# Patient Record
Sex: Female | Born: 1980 | Race: Black or African American | Hispanic: No | Marital: Single | Smoking: Never smoker
Health system: Southern US, Community
[De-identification: ages and names within clinical notes are randomized; demographics above are authoritative.]

## PROBLEM LIST (undated history)

## (undated) DIAGNOSIS — I10 Essential (primary) hypertension: Secondary | ICD-10-CM

## (undated) DIAGNOSIS — A6000 Herpesviral infection of urogenital system, unspecified: Secondary | ICD-10-CM

## (undated) DIAGNOSIS — J45909 Unspecified asthma, uncomplicated: Secondary | ICD-10-CM

## (undated) DIAGNOSIS — T7840XA Allergy, unspecified, initial encounter: Secondary | ICD-10-CM

## (undated) DIAGNOSIS — E669 Obesity, unspecified: Secondary | ICD-10-CM

## (undated) HISTORY — DX: Obesity, unspecified: E66.9

## (undated) HISTORY — DX: Herpesviral infection of urogenital system, unspecified: A60.00

## (undated) HISTORY — DX: Allergy, unspecified, initial encounter: T78.40XA

---

## 2006-12-08 ENCOUNTER — Emergency Department (HOSPITAL_COMMUNITY): Admission: EM | Admit: 2006-12-08 | Discharge: 2006-12-08 | Payer: Self-pay | Admitting: Emergency Medicine

## 2010-07-17 ENCOUNTER — Ambulatory Visit: Payer: Self-pay | Admitting: Diagnostic Radiology

## 2010-07-17 ENCOUNTER — Emergency Department (HOSPITAL_BASED_OUTPATIENT_CLINIC_OR_DEPARTMENT_OTHER): Admission: EM | Admit: 2010-07-17 | Discharge: 2010-07-17 | Payer: Self-pay | Admitting: Emergency Medicine
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
## 2012-05-05 LAB — HM PAP SMEAR

## 2013-01-13 ENCOUNTER — Encounter: Payer: Self-pay | Admitting: *Deleted

## 2013-01-13 ENCOUNTER — Ambulatory Visit: Payer: Self-pay | Admitting: Family Medicine

## 2013-01-15 ENCOUNTER — Ambulatory Visit (INDEPENDENT_AMBULATORY_CARE_PROVIDER_SITE_OTHER): Payer: BC Managed Care – PPO | Admitting: Family Medicine

## 2013-01-15 ENCOUNTER — Encounter: Payer: Self-pay | Admitting: Family Medicine

## 2013-01-15 ENCOUNTER — Ambulatory Visit: Payer: BC Managed Care – PPO | Admitting: Family Medicine

## 2013-01-15 VITALS — BP 142/99 | HR 76 | Wt 240.0 lb

## 2013-01-15 DIAGNOSIS — R059 Cough, unspecified: Secondary | ICD-10-CM

## 2013-01-15 DIAGNOSIS — R05 Cough: Secondary | ICD-10-CM

## 2013-01-15 DIAGNOSIS — J309 Allergic rhinitis, unspecified: Secondary | ICD-10-CM

## 2013-01-15 DIAGNOSIS — J209 Acute bronchitis, unspecified: Secondary | ICD-10-CM

## 2013-01-15 MED ORDER — BENZONATATE 200 MG PO CAPS: 200.0000 mg | ORAL_CAPSULE | Freq: Three times a day (TID) | ORAL | Status: DC | PRN

## 2013-01-15 MED ORDER — MONTELUKAST SODIUM 10 MG PO TABS: 10.0000 mg | ORAL_TABLET | Freq: Every day | ORAL | Status: AC

## 2013-01-15 MED ORDER — HYDROCODONE-HOMATROPINE 5-1.5 MG/5ML PO SYRP: ORAL_SOLUTION | ORAL | Status: DC

## 2013-01-15 NOTE — Patient Instructions (Addendum)
1)  Bronchitis:  Get back on your Singulair at night.  Add some Hycodan at night and Tessalon Perles 3 times per day for cough.  You can take Tessalon Perles and Delsym 2 tsp 2 times per day if needed.  You may also want to try some [facility].    Bronchitis Bronchitis is the body's way of reacting to injury and/or infection (inflammation) of the bronchi. Bronchi are the air tubes that extend from the windpipe into the lungs. If the inflammation becomes severe, it may cause shortness of breath. CAUSES  Inflammation may be caused by:  A virus.  Germs (bacteria).  Dust.  Allergens.  Pollutants and many other irritants. The cells lining the bronchial tree are covered with tiny hairs (cilia). These constantly beat upward, away from the lungs, toward the mouth. This keeps the lungs free of pollutants. When these cells become too irritated and are unable to do their job, mucus begins to develop. This causes the characteristic cough of bronchitis. The cough clears the lungs when the cilia are unable to do their job. Without either of these protective mechanisms, the mucus would settle in the lungs. Then you would develop pneumonia. Smoking is a common cause of bronchitis and can contribute to pneumonia. Stopping this habit is the single most important thing you can do to help yourself. TREATMENT   Your caregiver may prescribe an antibiotic if the cough is caused by bacteria. Also, medicines that open up your airways make it easier to breathe. Your caregiver may also recommend or prescribe an expectorant. It will loosen the mucus to be coughed up. Only take over-the-counter or prescription medicines for pain, discomfort, or fever as directed by your caregiver.  Removing whatever causes the problem (smoking, for example) is critical to preventing the problem from getting worse.  Cough suppressants may be prescribed for relief of cough symptoms.  Inhaled medicines may be prescribed to help with  symptoms now and to help prevent problems from returning.  For those with recurrent (chronic) bronchitis, there may be a need for steroid medicines. SEEK IMMEDIATE MEDICAL CARE IF:   During treatment, you develop more pus-like mucus (purulent sputum).  You have a fever.  Your baby is older than 3 months with a rectal temperature of 102 F (38.9 C) or higher.  Your baby is 75 months old or younger with a rectal temperature of 100.4 F (38 C) or higher.  You become progressively more ill.  You have increased difficulty breathing, wheezing, or shortness of breath. It is necessary to seek immediate medical care if you are elderly or sick from any other disease. MAKE SURE YOU:   Understand these instructions.  Will watch your condition.  Will get help right away if you are not doing well or get worse. Document Released: 10/07/2005 Document Revised: 12/30/2011 Document Reviewed: 08/16/2008 [facility] Patient Information 2013 [place], [place].

## 2013-01-15 NOTE — Progress Notes (Signed)
Subjective:     Patient ID: April Lewis, female   DOB: 03-13-1981, 32 y.o.   MRN: [id number]  HPI April Lewis is here today to discuss her chest congestion and cough.  She first became ill on 12/13/12 and was seen at a [facility].  She was given a round of Medrol-pack, albuterol inhahler and cough syrup.  She felt some relief but her symptoms returned on 01/12/13 and she was seen again at the urgent care.  This time she was also put on Symbicort and was given an antibiotic.  She continues to struggle with her symptoms and wanted another opinion about what to do to get better.  She has been on Singulair in the past but has been out of this medication.     Review of Systems  Respiratory: Negative for cough, chest tightness, shortness of breath and wheezing.        Objective:   Physical Exam  Constitutional: She appears well-nourished. No distress.  Neck: No thyromegaly present.  Cardiovascular: Normal rate, regular rhythm and normal heart sounds.   Pulmonary/Chest: Effort normal and breath sounds normal. She has no wheezes.  Lymphadenopathy:    She has no cervical adenopathy.       Assessment:     Cough     Plan:      She is going to get back on her Singulair.  She was also given medications for her cough.

## 2013-01-17 ENCOUNTER — Encounter: Payer: Self-pay | Admitting: Family Medicine

## 2013-01-19 ENCOUNTER — Encounter: Payer: Self-pay | Admitting: Family Medicine

## 2013-01-21 DIAGNOSIS — R05 Cough: Secondary | ICD-10-CM | POA: Insufficient documentation

## 2013-01-21 DIAGNOSIS — R059 Cough, unspecified: Secondary | ICD-10-CM | POA: Insufficient documentation

## 2013-01-21 DIAGNOSIS — J209 Acute bronchitis, unspecified: Secondary | ICD-10-CM | POA: Insufficient documentation

## 2013-01-21 DIAGNOSIS — J309 Allergic rhinitis, unspecified: Secondary | ICD-10-CM | POA: Insufficient documentation

## 2013-06-25 ENCOUNTER — Other Ambulatory Visit: Payer: Self-pay | Admitting: *Deleted

## 2013-06-25 ENCOUNTER — Other Ambulatory Visit: Payer: Self-pay

## 2013-06-25 DIAGNOSIS — R5381 Other malaise: Secondary | ICD-10-CM

## 2013-06-25 DIAGNOSIS — E785 Hyperlipidemia, unspecified: Secondary | ICD-10-CM

## 2013-06-25 LAB — COMPLETE METABOLIC PANEL WITH GFR
ALT: 10 U/L (ref 0–35)
AST: 12 U/L (ref 0–37)
Albumin: 3.7 g/dL (ref 3.5–5.2)
Alkaline Phosphatase: 58 U/L (ref 39–117)
BUN: 11 mg/dL (ref 6–23)
CO2: 28 mEq/L (ref 19–32)
Calcium: 9.1 mg/dL (ref 8.4–10.5)
Chloride: 103 mEq/L (ref 96–112)
Creat: 0.7 mg/dL (ref 0.50–1.10)
GFR, Est African American: >89 mL/min
GFR, Est Non African American: >89 mL/min
Glucose, Bld: 79 mg/dL (ref 70–99)
Potassium: 4.4 mEq/L (ref 3.5–5.3)
Sodium: 138 mEq/L (ref 135–145)
Total Bilirubin: 0.3 mg/dL (ref 0.3–1.2)
Total Protein: 6.3 g/dL (ref 6.0–8.3)

## 2013-06-25 LAB — LIPID PANEL
Cholesterol: 157 mg/dL (ref 0–200)
HDL: 46 mg/dL (ref >39)
LDL Cholesterol: 95 mg/dL (ref 0–99)
Total CHOL/HDL Ratio: 3.4 Ratio
Triglycerides: 78 mg/dL (ref <150)
VLDL: 16 mg/dL (ref 0–40)

## 2013-06-25 LAB — CBC WITH DIFFERENTIAL/PLATELET
Basophils Absolute: 0 10*3/uL (ref 0.0–0.1)
Basophils Relative: 0 % (ref 0–1)
Eosinophils Absolute: 0.7 10*3/uL (ref 0.0–0.7)
Eosinophils Relative: 8 % — ABNORMAL HIGH (ref 0–5)
HCT: 35.9 % — ABNORMAL LOW (ref 36.0–46.0)
Hemoglobin: 12.6 g/dL (ref 12.0–15.0)
Lymphocytes Relative: 32 % (ref 12–46)
Lymphs Abs: 3.1 10*3/uL (ref 0.7–4.0)
MCH: 28.6 pg (ref 26.0–34.0)
MCHC: 35.1 g/dL (ref 30.0–36.0)
MCV: 81.4 fL (ref 78.0–100.0)
Monocytes Absolute: 0.5 10*3/uL (ref 0.1–1.0)
Monocytes Relative: 5 % (ref 3–12)
Neutro Abs: 5.2 10*3/uL (ref 1.7–7.7)
Neutrophils Relative %: 55 % (ref 43–77)
Platelets: 376 10*3/uL (ref 150–400)
RBC: 4.41 MIL/uL (ref 3.87–5.11)
RDW: 15.1 % (ref 11.5–15.5)
WBC: 9.4 10*3/uL (ref 4.0–10.5)

## 2013-06-25 LAB — TSH: TSH: 1.032 u[IU]/mL (ref 0.350–4.500)

## 2013-07-06 ENCOUNTER — Ambulatory Visit (INDEPENDENT_AMBULATORY_CARE_PROVIDER_SITE_OTHER): Payer: BC Managed Care – PPO | Admitting: Family Medicine

## 2013-07-06 ENCOUNTER — Other Ambulatory Visit (HOSPITAL_COMMUNITY)
Admission: RE | Admit: 2013-07-06 | Discharge: 2013-07-06 | Disposition: A | Discharge status: Home / Self Care | Payer: BC Managed Care – PPO | Source: Ambulatory Visit | Attending: Family Medicine | Admitting: Family Medicine

## 2013-07-06 ENCOUNTER — Encounter: Payer: Self-pay | Admitting: Family Medicine

## 2013-07-06 VITALS — BP 111/74 | HR 75 | Resp 16 | Ht 63.25 in | Wt 238.0 lb

## 2013-07-06 DIAGNOSIS — Z01419 Encounter for gynecological examination (general) (routine) without abnormal findings: Secondary | ICD-10-CM | POA: Insufficient documentation

## 2013-07-06 DIAGNOSIS — F329 Major depressive disorder, single episode, unspecified: Secondary | ICD-10-CM

## 2013-07-06 DIAGNOSIS — Z124 Encounter for screening for malignant neoplasm of cervix: Secondary | ICD-10-CM

## 2013-07-06 DIAGNOSIS — Z23 Encounter for immunization: Secondary | ICD-10-CM

## 2013-07-06 DIAGNOSIS — Z1151 Encounter for screening for human papillomavirus (HPV): Secondary | ICD-10-CM | POA: Insufficient documentation

## 2013-07-06 DIAGNOSIS — R5381 Other malaise: Secondary | ICD-10-CM

## 2013-07-06 DIAGNOSIS — Z0000 Encounter for general adult medical examination without abnormal findings: Secondary | ICD-10-CM

## 2013-07-06 DIAGNOSIS — F32A Depression, unspecified: Secondary | ICD-10-CM

## 2013-07-06 DIAGNOSIS — F3289 Other specified depressive episodes: Secondary | ICD-10-CM

## 2013-07-06 MED ORDER — TERCONAZOLE 0.8 % VA CREA: 1.0000 | TOPICAL_CREAM | Freq: Every day | VAGINAL | Status: AC

## 2013-07-06 MED ORDER — WELLBUTRIN XL 150 MG PO TB24: 150.0000 mg | ORAL_TABLET | ORAL | Status: DC

## 2013-07-06 MED ORDER — WELLBUTRIN XL 300 MG PO TB24: 300.0000 mg | ORAL_TABLET | ORAL | Status: DC

## 2013-07-06 MED ORDER — FLUCONAZOLE 150 MG PO TABS: 150.0000 mg | ORAL_TABLET | Freq: Once | ORAL | Status: AC

## 2013-07-06 NOTE — Progress Notes (Signed)
Subjective:    Patient ID: April Lewis, female    DOB: 09/22/1981, 32 y.o.   MRN: [id number]  HPI  April Lewis is here today for her annual CPE with pap smear.  She has been feeling very fatigued since her last visit.   She also feels that her weight is contributing to her feeling depressed.       Review of Systems  Constitutional: Positive for fatigue.  HENT: Negative.   Eyes: Negative.   Respiratory: Negative.   Cardiovascular: Negative.   Gastrointestinal: Negative.   Endocrine: Negative.   Genitourinary: Negative.   Musculoskeletal: Negative.   Skin: Negative.   Allergic/Immunologic: Negative.   Neurological: Negative.   Hematological: Negative.   Psychiatric/Behavioral: Positive for dysphoric mood.     Past Medical History  Diagnosis Date  . Allergy   . Obesity   . Genital herpes    History   Social History  . Marital Status: Single    Spouse Name: N/A    Number of Children: N/A  . Years of Education: N/A   Occupational History  . Not on file.   Social History Main Topics  . Smoking status: Former Smoker    Types: Cigarettes    Quit date: 10/21/2010  . Smokeless tobacco: Never Used  . Alcohol Use: Yes     Comment: Occasionally  . Drug Use: No  . Sexual Activity: Not Currently   Other Topics Concern  . 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 Hypertension Mother   . Asthma Mother   . Diabetes Maternal Aunt   . Asthma Maternal Grandfather   . Diabetes Paternal Grandmother   . Hypertension Paternal Grandmother   . 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) Psychology (BA)    Alcohol Use:  Occasional   Drug Use:  None   Diet:  Regular   Exercise:  None   Hobbies: Reading/ Watching TV                            Objective:   Physical Exam  Vitals reviewed. Constitutional: She is oriented to person, place, and time. She appears well-developed and well-nourished.  HENT:  Head: Normocephalic and atraumatic.  Right Ear: External ear normal.  Left Ear: External ear normal.  Nose: Nose normal.  Mouth/Throat: Oropharynx is clear and moist.  Eyes: Conjunctivae and EOM are normal. Pupils are equal, round, and reactive to light.  Neck: Normal range of motion. No thyromegaly present.  Cardiovascular: Normal rate, regular rhythm, normal heart sounds and  intact distal pulses.  Exam reveals no gallop and no friction rub.   No murmur heard. Pulmonary/Chest: Effort normal and breath sounds normal. Right breast exhibits no inverted nipple, no mass, no nipple discharge, no skin change and no tenderness. Left breast exhibits no inverted nipple, no mass, no nipple discharge, no skin change and no tenderness. Breasts are symmetrical.  Abdominal: Soft. Bowel sounds are normal. Hernia confirmed negative in the right inguinal area and confirmed negative in the left inguinal area.  Genitourinary: Vagina normal and uterus normal. Pelvic exam was performed with patient supine. There is no rash, tenderness or lesion on the right labia. There is no rash, tenderness or lesion on the left labia. No vaginal discharge found.  Musculoskeletal: Normal range of motion. She exhibits no edema and no tenderness.  Lymphadenopathy:    She has no cervical adenopathy.       Right: No inguinal  adenopathy present.       Left: No inguinal adenopathy present.  Neurological: She is alert and oriented to person, place, and time. She has normal reflexes.  Skin: Skin is warm and dry.  Tattoos (3 Leaf Clover - Behind Right Ear, Stars - Lower Mid Back, Cancer Zodiac Sign - Upper Mid Back; Necklace W/Cross - Right Ankle, Chinese Symbol - Upper Right Foot, Butterfly - Left Lateral Calf, Heart/Name - Upper Left Arm.)   Psychiatric: She has a normal mood and affect. Her behavior is normal. Judgment and thought content normal.          Assessment & Plan:

## 2013-09-18 DIAGNOSIS — R5381 Other malaise: Secondary | ICD-10-CM | POA: Insufficient documentation

## 2013-09-18 DIAGNOSIS — Z0000 Encounter for general adult medical examination without abnormal findings: Secondary | ICD-10-CM | POA: Insufficient documentation

## 2013-09-18 DIAGNOSIS — Z23 Encounter for immunization: Secondary | ICD-10-CM | POA: Insufficient documentation

## 2013-09-18 DIAGNOSIS — F32A Depression, unspecified: Secondary | ICD-10-CM | POA: Insufficient documentation

## 2013-09-18 DIAGNOSIS — F329 Major depressive disorder, single episode, unspecified: Secondary | ICD-10-CM | POA: Insufficient documentation

## 2013-09-18 DIAGNOSIS — Z124 Encounter for screening for malignant neoplasm of cervix: Secondary | ICD-10-CM | POA: Insufficient documentation

## 2013-09-18 NOTE — Assessment & Plan Note (Signed)
Normal exam; we discussed preventative issues for the patient's sex and age.   

## 2013-09-18 NOTE — Assessment & Plan Note (Signed)
A pap was done without difficulty.  We're checking for high risk HPV with reflex to 16/18 if positive.   

## 2013-09-18 NOTE — Assessment & Plan Note (Signed)
We'll see if her energy level improves with Wellbutrin.

## 2013-09-18 NOTE — Assessment & Plan Note (Signed)
The patient confirmed that they are not allergic to eggs and have never had a bad reaction with the flu shot in the past.  The vaccination was given without difficulty.   

## 2013-09-18 NOTE — Assessment & Plan Note (Signed)
She was given prescriptions for Wellbutrin.

## 2013-11-06 ENCOUNTER — Other Ambulatory Visit: Payer: Self-pay | Admitting: Family Medicine

## 2014-02-20 ENCOUNTER — Other Ambulatory Visit: Payer: Self-pay | Admitting: Family Medicine

## 2014-02-25 ENCOUNTER — Ambulatory Visit: Payer: BC Managed Care – PPO | Admitting: Family Medicine

## 2020-11-10 ENCOUNTER — Encounter (HOSPITAL_COMMUNITY): Payer: Self-pay

## 2020-11-10 ENCOUNTER — Inpatient Hospital Stay (HOSPITAL_COMMUNITY): Payer: 59

## 2020-11-10 ENCOUNTER — Other Ambulatory Visit: Payer: Self-pay

## 2020-11-10 ENCOUNTER — Inpatient Hospital Stay (HOSPITAL_COMMUNITY)
Admission: EM | Admit: 2020-11-10 | Discharge: 2020-11-18 | DRG: 177 | Disposition: A | Discharge status: Home / Self Care | Payer: 59 | Attending: Internal Medicine | Admitting: Internal Medicine

## 2020-11-10 ENCOUNTER — Emergency Department (HOSPITAL_COMMUNITY): Payer: 59

## 2020-11-10 DIAGNOSIS — Z7951 Long term (current) use of inhaled steroids: Secondary | ICD-10-CM

## 2020-11-10 DIAGNOSIS — U071 COVID-19: Principal | ICD-10-CM | POA: Diagnosis present

## 2020-11-10 DIAGNOSIS — J9601 Acute respiratory failure with hypoxia: Secondary | ICD-10-CM | POA: Diagnosis present

## 2020-11-10 DIAGNOSIS — E876 Hypokalemia: Secondary | ICD-10-CM | POA: Diagnosis not present

## 2020-11-10 DIAGNOSIS — R7989 Other specified abnormal findings of blood chemistry: Secondary | ICD-10-CM | POA: Diagnosis present

## 2020-11-10 DIAGNOSIS — J1282 Pneumonia due to coronavirus disease 2019: Secondary | ICD-10-CM | POA: Diagnosis present

## 2020-11-10 DIAGNOSIS — J810 Acute pulmonary edema: Secondary | ICD-10-CM | POA: Diagnosis not present

## 2020-11-10 DIAGNOSIS — R609 Edema, unspecified: Secondary | ICD-10-CM | POA: Diagnosis not present

## 2020-11-10 DIAGNOSIS — I10 Essential (primary) hypertension: Secondary | ICD-10-CM | POA: Diagnosis present

## 2020-11-10 DIAGNOSIS — J45909 Unspecified asthma, uncomplicated: Secondary | ICD-10-CM | POA: Diagnosis present

## 2020-11-10 DIAGNOSIS — Z833 Family history of diabetes mellitus: Secondary | ICD-10-CM

## 2020-11-10 DIAGNOSIS — R0600 Dyspnea, unspecified: Secondary | ICD-10-CM

## 2020-11-10 DIAGNOSIS — Z880 Allergy status to penicillin: Secondary | ICD-10-CM

## 2020-11-10 DIAGNOSIS — Z8249 Family history of ischemic heart disease and other diseases of the circulatory system: Secondary | ICD-10-CM | POA: Diagnosis not present

## 2020-11-10 DIAGNOSIS — E669 Obesity, unspecified: Secondary | ICD-10-CM | POA: Diagnosis not present

## 2020-11-10 DIAGNOSIS — Z825 Family history of asthma and other chronic lower respiratory diseases: Secondary | ICD-10-CM

## 2020-11-10 DIAGNOSIS — Z79899 Other long term (current) drug therapy: Secondary | ICD-10-CM | POA: Diagnosis not present

## 2020-11-10 DIAGNOSIS — Z6841 Body Mass Index (BMI) 40.0 and over, adult: Secondary | ICD-10-CM

## 2020-11-10 DIAGNOSIS — F32A Depression, unspecified: Secondary | ICD-10-CM | POA: Diagnosis present

## 2020-11-10 HISTORY — DX: Unspecified asthma, uncomplicated: J45.909

## 2020-11-10 HISTORY — DX: Essential (primary) hypertension: I10

## 2020-11-10 LAB — COMPREHENSIVE METABOLIC PANEL
ALT: 24 U/L (ref 0–44)
AST: 39 U/L (ref 15–41)
Albumin: 3.9 g/dL (ref 3.5–5.0)
Alkaline Phosphatase: 63 U/L (ref 38–126)
Anion gap: 11 (ref 5–15)
BUN: 5 mg/dL — ABNORMAL LOW (ref 6–20)
CO2: 26 mmol/L (ref 22–32)
Calcium: 8.9 mg/dL (ref 8.9–10.3)
Chloride: 97 mmol/L — ABNORMAL LOW (ref 98–111)
Creatinine, Ser: 0.9 mg/dL (ref 0.44–1.00)
GFR, Estimated: >60 mL/min (ref >60)
Glucose, Bld: 122 mg/dL — ABNORMAL HIGH (ref 70–99)
Potassium: 3.5 mmol/L (ref 3.5–5.1)
Sodium: 134 mmol/L — ABNORMAL LOW (ref 135–145)
Total Bilirubin: 0.4 mg/dL (ref 0.3–1.2)
Total Protein: 8 g/dL (ref 6.5–8.1)

## 2020-11-10 LAB — CBC WITH DIFFERENTIAL/PLATELET
Abs Immature Granulocytes: 0.04 10*3/uL (ref 0.00–0.07)
Basophils Absolute: 0 10*3/uL (ref 0.0–0.1)
Basophils Relative: 0 %
Eosinophils Absolute: 0 10*3/uL (ref 0.0–0.5)
Eosinophils Relative: 0 %
HCT: 40.7 % (ref 36.0–46.0)
Hemoglobin: 12.8 g/dL (ref 12.0–15.0)
Immature Granulocytes: 1 %
Lymphocytes Relative: 9 %
Lymphs Abs: 0.7 10*3/uL (ref 0.7–4.0)
MCH: 24.2 pg — ABNORMAL LOW (ref 26.0–34.0)
MCHC: 31.4 g/dL (ref 30.0–36.0)
MCV: 76.9 fL — ABNORMAL LOW (ref 80.0–100.0)
Monocytes Absolute: 0.3 10*3/uL (ref 0.1–1.0)
Monocytes Relative: 3 %
Neutro Abs: 7.6 10*3/uL (ref 1.7–7.7)
Neutrophils Relative %: 87 %
Platelets: 300 10*3/uL (ref 150–400)
RBC: 5.29 MIL/uL — ABNORMAL HIGH (ref 3.87–5.11)
RDW: 16.6 % — ABNORMAL HIGH (ref 11.5–15.5)
WBC: 8.7 10*3/uL (ref 4.0–10.5)
nRBC: 0 % (ref 0.0–0.2)

## 2020-11-10 LAB — TROPONIN I (HIGH SENSITIVITY)
Troponin I (High Sensitivity): 18 ng/L — ABNORMAL HIGH (ref <18)
Troponin I (High Sensitivity): 18 ng/L — ABNORMAL HIGH (ref <18)
Troponin I (High Sensitivity): 16 ng/L (ref <18)

## 2020-11-10 LAB — D-DIMER, QUANTITATIVE: D-Dimer, Quant: 0.59 ug/mL-FEU — ABNORMAL HIGH (ref 0.00–0.50)

## 2020-11-10 LAB — FIBRINOGEN: Fibrinogen: 557 mg/dL — ABNORMAL HIGH (ref 210–475)

## 2020-11-10 LAB — I-STAT BETA HCG BLOOD, ED (MC, WL, AP ONLY): I-stat hCG, quantitative: <5 m[IU]/mL (ref <5)

## 2020-11-10 LAB — TRIGLYCERIDES: Triglycerides: 115 mg/dL (ref <150)

## 2020-11-10 LAB — C-REACTIVE PROTEIN: CRP: 13 mg/dL — ABNORMAL HIGH (ref <1.0)

## 2020-11-10 LAB — LACTIC ACID, PLASMA
Lactic Acid, Venous: 2.2 mmol/L (ref 0.5–1.9)
Lactic Acid, Venous: 1.6 mmol/L (ref 0.5–1.9)

## 2020-11-10 LAB — FERRITIN: Ferritin: 78 ng/mL (ref 11–307)

## 2020-11-10 LAB — PROCALCITONIN: Procalcitonin: <0.1 ng/mL

## 2020-11-10 LAB — HIV ANTIBODY (ROUTINE TESTING W REFLEX): HIV Screen 4th Generation wRfx: NONREACTIVE

## 2020-11-10 LAB — LACTATE DEHYDROGENASE: LDH: 287 U/L — ABNORMAL HIGH (ref 98–192)

## 2020-11-10 MED ORDER — SODIUM CHLORIDE 0.9 % IV SOLN
200.0000 mg | Freq: Once | INTRAVENOUS | Status: AC
  Administered 2020-11-10: 200 mg via INTRAVENOUS
  Filled 2020-11-10: qty 200

## 2020-11-10 MED ORDER — METHYLPREDNISOLONE SODIUM SUCC 125 MG IJ SOLR: 0.5000 mg/kg | Freq: Two times a day (BID) | INTRAMUSCULAR | Status: DC

## 2020-11-10 MED ORDER — HYDROCOD POLST-CPM POLST ER 10-8 MG/5ML PO SUER: 5.0000 mL | Freq: Two times a day (BID) | ORAL | Status: DC | PRN

## 2020-11-10 MED ORDER — SODIUM CHLORIDE 0.9 % IV SOLN
100.0000 mg | Freq: Every day | INTRAVENOUS | Status: AC
  Administered 2020-11-11 – 2020-11-14 (×4): 100 mg via INTRAVENOUS
  Filled 2020-11-10 (×4): qty 20

## 2020-11-10 MED ORDER — ENOXAPARIN SODIUM 60 MG/0.6ML ~~LOC~~ SOLN
60.0000 mg | SUBCUTANEOUS | Status: DC
  Administered 2020-11-10 – 2020-11-14 (×5): 60 mg via SUBCUTANEOUS
  Filled 2020-11-10 (×5): qty 0.6

## 2020-11-10 MED ORDER — ACETAMINOPHEN 325 MG PO TABS
650.0000 mg | ORAL_TABLET | Freq: Once | ORAL | Status: AC
  Administered 2020-11-10: 650 mg via ORAL
  Filled 2020-11-10: qty 2

## 2020-11-10 MED ORDER — ASCORBIC ACID 500 MG PO TABS
500.0000 mg | ORAL_TABLET | Freq: Every day | ORAL | Status: DC
  Administered 2020-11-10 – 2020-11-18 (×9): 500 mg via ORAL
  Filled 2020-11-10 (×9): qty 1

## 2020-11-10 MED ORDER — ZINC SULFATE 220 (50 ZN) MG PO CAPS
220.0000 mg | ORAL_CAPSULE | Freq: Every day | ORAL | Status: DC
  Administered 2020-11-10 – 2020-11-18 (×9): 220 mg via ORAL
  Filled 2020-11-10 (×9): qty 1

## 2020-11-10 MED ORDER — ACETAMINOPHEN 325 MG PO TABS
650.0000 mg | ORAL_TABLET | Freq: Four times a day (QID) | ORAL | Status: DC | PRN
  Administered 2020-11-10 – 2020-11-18 (×5): 650 mg via ORAL
  Filled 2020-11-10 (×5): qty 2

## 2020-11-10 MED ORDER — PREDNISONE 5 MG PO TABS
50.0000 mg | ORAL_TABLET | Freq: Every day | ORAL | Status: DC
  Administered 2020-11-13 – 2020-11-18 (×6): 50 mg via ORAL
  Filled 2020-11-10: qty 1
  Filled 2020-11-10: qty 2
  Filled 2020-11-10 (×4): qty 1

## 2020-11-10 MED ORDER — ALUM & MAG HYDROXIDE-SIMETH 200-200-20 MG/5ML PO SUSP
30.0000 mL | ORAL | Status: DC | PRN
  Administered 2020-11-10 – 2020-11-15 (×6): 30 mL via ORAL
  Filled 2020-11-10 (×5): qty 30

## 2020-11-10 MED ORDER — PREDNISONE 50 MG PO TABS: 50.0000 mg | ORAL_TABLET | Freq: Every day | ORAL | Status: DC

## 2020-11-10 MED ORDER — METHYLPREDNISOLONE SODIUM SUCC 125 MG IJ SOLR
60.0000 mg | Freq: Two times a day (BID) | INTRAMUSCULAR | Status: AC
  Administered 2020-11-10 – 2020-11-13 (×6): 60 mg via INTRAVENOUS
  Filled 2020-11-10 (×6): qty 2

## 2020-11-10 MED ORDER — DEXAMETHASONE SODIUM PHOSPHATE 10 MG/ML IJ SOLN
8.0000 mg | Freq: Once | INTRAMUSCULAR | Status: AC
  Administered 2020-11-10: 8 mg via INTRAVENOUS
  Filled 2020-11-10: qty 1

## 2020-11-10 MED ORDER — SODIUM CHLORIDE 0.9 % IV SOLN: Freq: Once | INTRAVENOUS | Status: AC

## 2020-11-10 MED ORDER — IOHEXOL 350 MG/ML SOLN
100.0000 mL | Freq: Once | INTRAVENOUS | Status: AC | PRN
  Administered 2020-11-10: 100 mL via INTRAVENOUS

## 2020-11-10 MED ORDER — GUAIFENESIN-DM 100-10 MG/5ML PO SYRP
10.0000 mL | ORAL_SOLUTION | ORAL | Status: DC | PRN
  Administered 2020-11-11 – 2020-11-12 (×2): 10 mL via ORAL
  Filled 2020-11-10 (×2): qty 10

## 2020-11-10 MED ORDER — IPRATROPIUM-ALBUTEROL 20-100 MCG/ACT IN AERS
1.0000 | INHALATION_SPRAY | Freq: Four times a day (QID) | RESPIRATORY_TRACT | Status: DC
  Administered 2020-11-11 – 2020-11-16 (×23): 1 via RESPIRATORY_TRACT
  Filled 2020-11-10: qty 4

## 2020-11-10 NOTE — ED Notes (Signed)
Pt provided with [place] sandwich, graham crackers and water. Pt denies any other complaints or concerns at this time

## 2020-11-10 NOTE — ED Notes (Signed)
Patient was given her dinner tray.

## 2020-11-10 NOTE — H&P (Signed)
History and Physical    April Lewis:[id number] DOB: Jul 13, 1981 DOA: 11/10/2020  PCP: April Scarce, MD  Patient coming from: Home  Chief Complaint: dyspnea, cough  HPI: April Lewis is a 40 y.o. female with medical history significant of MDD, asthma. Presenting with dyspnea and cough. Her symptoms started 5 days ago. They are worse with exertion. She tried several OTC meds to help, but they did not. She went to urgent care and was diagnosed w/ COVID 19. She was advised to go to the ED if symptoms worsened. Otherwise, she needed to self-isolate. Her symptoms progressed to add fevers and chills. She decided to come to the ED for help.   ED Course: CXR c/w COVID PNA. Started on remdes and decadron. CTA PE ordered. [facility] called for admission.    Review of Systems:  Denies CP, palpitations, syncopal episodes, N/V/D. Reports 1 sick contact w/ similar symptoms  Review of systems is otherwise negative for all not mentioned in HPI.   PMHx Past Medical History:  Diagnosis Date  . Allergy   . Asthma   . Genital herpes   . Hypertension   . Obesity     PSHx History reviewed. No pertinent surgical history.  SocHx  reports that she has never smoked. She has never used smokeless tobacco. She reports current alcohol use. She reports that she does not use drugs.  Allergies  Allergen Reactions  . Penicillins Hives  . Penicillins Rash    FamHx Family History  Problem Relation Age of Onset  . Hypertension Mother   . Asthma Mother   . Diabetes Maternal Aunt   . Asthma Maternal Grandfather   . Diabetes Paternal Grandmother   . Hypertension Paternal Grandmother   . Cancer Paternal Grandmother     Prior to Admission medications   Medication Sig Start Date End Date Taking? Authorizing Provider  albuterol (PROVENTIL HFA;VENTOLIN HFA) 108 (90 BASE) MCG/ACT inhaler Inhale 2 puffs into the lungs every 6 (six) hours as needed for wheezing.    [provider]  aluminum  chloride (DRYSOL) 20 % external solution Apply topically at bedtime.    [provider]  Boric Acid POWD 1 applicator by Does not apply route.    [provider]  budesonide-formoterol (SYMBICORT) 160-4.5 MCG/ACT inhaler Inhale 2 puffs into the lungs 2 (two) times daily.    [provider]  cetirizine (ZYRTEC) 10 MG tablet Take 10 mg by mouth daily.    [provider]  famciclovir (FAMVIR) 500 MG tablet Take 500 mg by mouth 3 (three) times daily.    [provider]  famotidine (PEPCID) 20 MG tablet Take 20 mg by mouth daily. 10/01/20   [provider]  losartan-hydrochlorothiazide (HYZAAR) 50-12.5 MG tablet Take 1 tablet by mouth daily. 09/05/20   [provider]  montelukast (SINGULAIR) 10 MG tablet Take 1 tablet (10 mg total) by mouth at bedtime. 01/15/13 01/15/14  April Scarce, MD  topiramate (TOPAMAX) 50 MG tablet Take 50 mg by mouth 2 (two) times daily.    [provider]  valACYclovir (VALTREX) 1000 MG tablet Take 1,000 mg by mouth daily.    [provider]  WELLBUTRIN XL 150 MG 24 hr tablet Take 1 tablet (150 mg total) by mouth every morning. 07/06/13 07/06/14  Zanard, Hinton Dyer, MD  WELLBUTRIN XL 300 MG 24 hr tablet Take 1 tablet (300 mg total) by mouth every morning. 07/06/13 07/06/14  April Scarce, MD  Physical Exam: Vitals:   11/10/20 1026 11/10/20 1028 11/10/20 1045 11/10/20 1100  BP:   (!) 141/82 (!) 132/59  Pulse:   (!) 124 (!) 126  Resp:   (!) 22 (!) 23  Temp:      TempSrc:      SpO2: 96%  98% 95%  Weight:  113.4 kg    Height:  5\' 3"  (1.6 m)      General: 40 y.o. female resting in bed in NAD Eyes: PERRL, normal sclera ENMT: Nares patent w/o discharge, orophaynx clear, dentition normal, ears w/o discharge/lesions/ulcers Neck: Supple, trachea midline Cardiovascular: RRR, +S1, S2, no m/g/r, equal pulses throughout Respiratory: CTABL, no w/r/r, normal WOB on 2L [place] GI: BS+, NDNT, no masses  noted, no organomegaly noted MSK: No e/c/c Skin: No rashes, bruises, ulcerations noted Neuro: A&O x 3, no focal deficits Psyc: Appropriate interaction and affect, calm/cooperative  Labs on Admission: I have personally reviewed following labs and imaging studies  CBC: Recent Labs  Lab 11/10/20 1040  WBC 8.7  NEUTROABS 7.6  HGB 12.8  HCT 40.7  MCV 76.9*  PLT 300   Basic Metabolic Panel: Recent Labs  Lab 11/10/20 1040  NA 134*  K 3.5  CL 97*  CO2 26  GLUCOSE 122*  BUN 5*  CREATININE 0.90  CALCIUM 8.9   GFR: Estimated Creatinine Clearance: 101.7 mL/min (by C-G formula based on SCr of 0.9 mg/dL). Liver Function Tests: Recent Labs  Lab 11/10/20 1040  AST 39  ALT 24  ALKPHOS 63  BILITOT 0.4  PROT 8.0  ALBUMIN 3.9   No results for input(s): LIPASE, AMYLASE in the last 168 hours. No results for input(s): AMMONIA in the last 168 hours. Coagulation Profile: No results for input(s): INR, PROTIME in the last 168 hours. Cardiac Enzymes: No results for input(s): CKTOTAL, CKMB, CKMBINDEX, TROPONINI in the last 168 hours. BNP (last 3 results) No results for input(s): PROBNP in the last 8760 hours. HbA1C: No results for input(s): HGBA1C in the last 72 hours. CBG: No results for input(s): GLUCAP in the last 168 hours. Lipid Profile: No results for input(s): CHOL, HDL, LDLCALC, TRIG, CHOLHDL, LDLDIRECT in the last 72 hours. Thyroid Function Tests: No results for input(s): TSH, T4TOTAL, FREET4, T3FREE, THYROIDAB in the last 72 hours. Anemia Panel: No results for input(s): VITAMINB12, FOLATE, FERRITIN, TIBC, IRON, RETICCTPCT in the last 72 hours. Urine analysis: No results found for: COLORURINE, APPEARANCEUR, LABSPEC, PHURINE, GLUCOSEU, HGBUR, BILIRUBINUR, KETONESUR, PROTEINUR, UROBILINOGEN, NITRITE, LEUKOCYTESUR  Radiological Exams on Admission: DG Chest Port 1 View  Result Date: 11/10/2020 CLINICAL DATA:  Shortness of breath COVID positive EXAM: PORTABLE CHEST 1 VIEW  COMPARISON:  Chest radiograph July 17, 2010 FINDINGS: The heart size and mediastinal contours are within normal limits. Low lung volumes. Left greater than right, peripheral predominant patchy opacities. No pleural effusion or pneumothorax. The visualized skeletal structures are unremarkable. IMPRESSION: Left greater than right, peripheral predominant patchy opacities compatible with COVID-19 pneumonia. Electronically Signed   By: July 19, 2010 MD   On: 11/10/2020 11:22    EKG: Independently reviewed. Sinus tach, no st elevations  Assessment/Plan COVID 19 PNA     - admit to inpt, med-surg     - steroids, remdes, inhalers, anti-tussives, IS, FV     - currently on 2L [place], wean O2 as able     - follow inflammatory markers, CTA PE pending  HTN     - resume home regimen when med rec complete  Asthma     -  she will be on steroids, combivent in-house as covid treatment  MDD     - continue home regimen when med rec complete  DVT prophylaxis: lovenox  Code Status: FULL  Family Communication: None at bedside.  Consults called: None   Status is: Inpatient  Remains inpatient appropriate because:Inpatient level of care appropriate due to severity of illness   Dispo: The patient is from: Home              Anticipated d/c is to: Home              Anticipated d/c date is: 3 days              Patient currently is not medically stable to d/c.  Teddy Spike DO [facility]  If 7PM-7AM, please contact night-coverage [email or web address]  11/10/2020, 12:03 PM

## 2020-11-10 NOTE — ED Notes (Signed)
Pt is requesting pain medication for her back pain 7/10 and indigestion. Pt reports she typically takes pepcid for reflux .paged the hospitalist at this time.

## 2020-11-10 NOTE — ED Notes (Signed)
Lunch tray ordered for pt at this time.

## 2020-11-10 NOTE — ED Triage Notes (Signed)
Pt reports positive covid test at [facility] 11/08/20. Shortness of breath has worsened since. Has been taking otc meds for cough/cold s/s. Was using albuterol IH which she felt she was not able to breath medicine in.

## 2020-11-10 NOTE — ED Provider Notes (Signed)
[facility]-EMERGENCY DEPT Provider Note   CSN: [id number] Arrival date & time: 11/10/20  3500     History Chief Complaint  Patient presents with  . Shortness of Breath    NYIA April Lewis is a 40 y.o. female history of obesity.  Patient reports that she is unvaccinated for COVID-19.  Reports 7 days ago she developed symptoms including rhinorrhea, sore throat, cough.  Initially her symptoms had been improving but she worsened again 2 days ago.  She reports that 2 days ago she developed shortness of breath and chest pain she reports chest pain is a sharp sensation whenever she takes deep breaths or coughs this is moderate in intensity nonradiating no alleviating factors.  She reports shortness of breath whenever she coughs or exerts herself which has been constant for 2 days.  Patient reports that her cough has become productive since yesterday she reports normally it is clear sputum but feels that she sees some "red in it".  Associated symptoms fever/chills, body aches.  Denies headache, vision changes, neck stiffness, abdominal pain, vomiting, diarrhea, extremity swelling/color change or any additional concerns.  HPI     Past Medical History:  Diagnosis Date  . Allergy   . Asthma   . Genital herpes   . Hypertension   . Obesity     Patient Active Problem List   Diagnosis Date Noted  . COVID-19 11/10/2020  . Depression 09/18/2013  . Other malaise and fatigue 09/18/2013  . Screening for malignant neoplasm of the cervix 09/18/2013  . Need for prophylactic vaccination and inoculation against influenza 09/18/2013  . Routine general medical examination at a health care facility 09/18/2013  . Allergic rhinitis 01/21/2013    History reviewed. No pertinent surgical history.   OB History   No obstetric history on file.     Family History  Problem Relation Age of Onset  . Hypertension Mother   . Asthma Mother   . Diabetes Maternal Aunt   . Asthma  Maternal Grandfather   . Diabetes Paternal Grandmother   . Hypertension Paternal Grandmother   . Cancer Paternal Grandmother     Social History   Tobacco Use  . Smoking status: Never Smoker  . Smokeless tobacco: Never Used  Substance Use Topics  . Alcohol use: Yes    Comment: Occasionally  . Drug use: No    Home Medications Prior to Admission medications   Medication Sig Start Date End Date Taking? Authorizing Provider  albuterol (PROVENTIL HFA;VENTOLIN HFA) 108 (90 BASE) MCG/ACT inhaler Inhale 2 puffs into the lungs every 6 (six) hours as needed for wheezing.    [provider]  aluminum chloride (DRYSOL) 20 % external solution Apply topically at bedtime.    [provider]  Boric Acid POWD 1 applicator by Does not apply route.    [provider]  budesonide-formoterol (SYMBICORT) 160-4.5 MCG/ACT inhaler Inhale 2 puffs into the lungs 2 (two) times daily.    [provider]  cetirizine (ZYRTEC) 10 MG tablet Take 10 mg by mouth daily.    [provider]  famciclovir (FAMVIR) 500 MG tablet Take 500 mg by mouth 3 (three) times daily.    [provider]  famotidine (PEPCID) 20 MG tablet Take 20 mg by mouth daily. 10/01/20   [provider]  losartan-hydrochlorothiazide (HYZAAR) 50-12.5 MG tablet Take 1 tablet by mouth daily. 09/05/20   [provider]  montelukast (SINGULAIR) 10 MG tablet Take 1 tablet (10 mg total) by mouth  at bedtime. 01/15/13 01/15/14  Gillian ScarceZanard, Robyn K, MD  topiramate (TOPAMAX) 50 MG tablet Take 50 mg by mouth 2 (two) times daily.    [provider]  valACYclovir (VALTREX) 1000 MG tablet Take 1,000 mg by mouth daily.    [provider]  WELLBUTRIN XL 150 MG 24 hr tablet Take 1 tablet (150 mg total) by mouth every morning. 07/06/13 07/06/14  Zanard, Hinton Dyerobyn K, MD  WELLBUTRIN XL 300 MG 24 hr tablet Take 1 tablet (300 mg total) by mouth every morning. 07/06/13 07/06/14  Gillian ScarceZanard, Robyn K, MD     Allergies    Penicillins and Penicillins  Review of Systems   Review of Systems Ten systems are reviewed and are negative for acute change except as noted in the HPI  Physical Exam Updated Vital Signs BP (!) 132/59   Pulse (!) 126   Temp (!) 100.7 F (38.2 C) (Oral)   Resp (!) 23   Ht 5\' 3"  (1.6 m)   Wt 113.4 kg   LMP 11/10/2020   SpO2 95%   BMI 44.29 kg/m   Physical Exam Constitutional:      General: She is not in acute distress.    Appearance: Normal appearance. She is well-developed. She is obese. She is ill-appearing. She is not toxic-appearing or diaphoretic.  HENT:     Head: Normocephalic and atraumatic.  Eyes:     General: Vision grossly intact. Gaze aligned appropriately.     Pupils: Pupils are equal, round, and reactive to light.  Neck:     Trachea: Trachea and phonation normal.  Cardiovascular:     Rate and Rhythm: Regular rhythm. Tachycardia present.  Pulmonary:     Effort: Pulmonary effort is normal. No accessory muscle usage or respiratory distress.     Breath sounds: Normal breath sounds.  Abdominal:     General: There is no distension.     Palpations: Abdomen is soft.     Tenderness: There is no abdominal tenderness. There is no guarding or rebound.  Musculoskeletal:        General: Normal range of motion.     Cervical back: Normal range of motion.     Right lower leg: No tenderness. No edema.     Left lower leg: No tenderness. No edema.  Skin:    General: Skin is warm and dry.  Neurological:     Mental Status: She is alert.     GCS: GCS eye subscore is 4. GCS verbal subscore is 5. GCS motor subscore is 6.     Comments: Speech is clear and goal oriented, follows commands Major Cranial nerves without deficit, no facial droop Moves extremities without ataxia, coordination intact  Psychiatric:        Behavior: Behavior normal.     ED Results / Procedures / Treatments   Labs (all labs ordered are listed, but only abnormal results are  displayed) Labs Reviewed  LACTIC ACID, PLASMA - Abnormal; Notable for the following components:      Result Value   Lactic Acid, Venous 2.2 (*)    All other components within normal limits  CBC WITH DIFFERENTIAL/PLATELET - Abnormal; Notable for the following components:   RBC 5.29 (*)    MCV 76.9 (*)    MCH 24.2 (*)    RDW 16.6 (*)    All other components within normal limits  COMPREHENSIVE METABOLIC PANEL - Abnormal; Notable for the following components:   Sodium 134 (*)    Chloride 97 (*)  Glucose, Bld 122 (*)    BUN 5 (*)    All other components within normal limits  D-DIMER, QUANTITATIVE (NOT AT [facility]) - Abnormal; Notable for the following components:   D-Dimer, Quant 0.59 (*)    All other components within normal limits  LACTATE DEHYDROGENASE - Abnormal; Notable for the following components:   LDH 287 (*)    All other components within normal limits  FIBRINOGEN - Abnormal; Notable for the following components:   Fibrinogen 557 (*)    All other components within normal limits  CULTURE, BLOOD (ROUTINE X 2)  CULTURE, BLOOD (ROUTINE X 2)  LACTIC ACID, PLASMA  PROCALCITONIN  FERRITIN  TRIGLYCERIDES  C-REACTIVE PROTEIN  I-STAT BETA HCG BLOOD, ED ([facility], [facility], [facility] ONLY)  TROPONIN I (HIGH SENSITIVITY)    EKG EKG Interpretation  Date/Time:  Friday November 10 2020 09:56:10 EST Ventricular Rate:  122 PR Interval:    QRS Duration: 86 QT Interval:  301 QTC Calculation: 429 R Axis:   71 Text Interpretation: Sinus tachycardia Borderline repolarization abnormality No old tracing to compare Confirmed by Melene Plan [phone]) on 11/10/2020 10:03:04 AM   Radiology DG Chest Port 1 View  Result Date: 11/10/2020 CLINICAL DATA:  Shortness of breath COVID positive EXAM: PORTABLE CHEST 1 VIEW COMPARISON:  Chest radiograph July 17, 2010 FINDINGS: The heart size and mediastinal contours are within normal limits. Low lung volumes. Left greater than right, peripheral predominant patchy  opacities. No pleural effusion or pneumothorax. The visualized skeletal structures are unremarkable. IMPRESSION: Left greater than right, peripheral predominant patchy opacities compatible with COVID-19 pneumonia. Electronically Signed   By: April Mayhew MD   On: 11/10/2020 11:22    Procedures .Critical Care Performed by: Bill Salinas, PA-C Authorized by: Bill Salinas, PA-C   Critical care provider statement:    Critical care time (minutes):  35   Critical care was necessary to treat or prevent imminent or life-threatening deterioration of the following conditions:  Respiratory failure   Critical care was time spent personally by me on the following activities:  Discussions with consultants, evaluation of patient's response to treatment, examination of patient, ordering and performing treatments and interventions, ordering and review of laboratory studies, ordering and review of radiographic studies, pulse oximetry, re-evaluation of patient's condition, obtaining history from patient or surrogate, review of old charts and development of treatment plan with patient or surrogate   Care discussed with: admitting provider     (including critical care time)  Medications Ordered in ED Medications  remdesivir 200 mg in sodium chloride 0.9% 250 mL IVPB (200 mg Intravenous New Bag/Given 11/10/20 1156)    Followed by  remdesivir 100 mg in sodium chloride 0.9 % 100 mL IVPB (has no administration in time range)  acetaminophen (TYLENOL) tablet 650 mg (650 mg Oral Given 11/10/20 1037)  dexamethasone (DECADRON) injection 8 mg (8 mg Intravenous Given 11/10/20 1037)    ED Course  I have reviewed the triage vital signs and the nursing notes.  Pertinent labs & imaging results that were available during my care of the patient were reviewed by me and considered in my medical decision making (see chart for details).  Clinical Course as of 11/10/20 1157  Fri Nov 10, 2020  1153 Dr. Ronaldo Miyamoto [BM]     Clinical Course User Index [BM] Elizabeth Palau   MDM Rules/Calculators/A&P  Additional history obtained from: 1. Nursing notes from this visit. 2. Review of electronic medical records.  I was able to review patient's urgent care visit from 11/08/2020, diagnosis of COVID-19.  She had a positive COVID rapid antigen test at that time ---------------------------------- 40 year old female who is unvaccinated for COVID presented for for symptoms onset 7 days ago she has now developed pleuritic chest pain and shortness of breath beginning 2 days ago.  On arrival she is febrile tachycardic tachypneic and hypoxic to 88% on room air.  Hypoxia improved on 2 L submental oxygen via nasal cannula.  Patient's symptoms are not consistent with COVID-19 viral infection however given she is also also endorsing some possible hemoptysis will obtain CT angio chest PE to evaluate for pulmonary embolism.  I have ordered patient Tylenol for fever and remdesivir/Decadron according to current guidelines for inpatient treatment of COVID-19.  COVID admission order set used for labs. - I ordered, reviewed and interpreted labs which include: Pregnancy test negative. CBC without leukocytosis to suggest bacterial infection, no anemia. CMP shows no emergent electrolyte derangement, AKI, LFT elevations or gap. D-dimer elevated at 0.59 may be secondary to COVID-19 viral infection Inflammatory markers fibrinogen and LDH elevated, consistent with COVID  CXR:  IMPRESSION:  Left greater than right, peripheral predominant patchy opacities  compatible with COVID-19 pneumonia.   EKG: Sinus tachycardia Borderline repolarization abnormality No old tracing to compare Confirmed by Melene Plan [phone]) on 11/10/2020 10:03:04 AM - Patient will require admission today for COVID-19 viral infection with bilateral pneumonia and acute hypoxic respiratory failure requiring supplemental oxygen.  Her CT study is  currently pending but will need admission despite findings.  Suspect patient's shortness of breath and chest pain today secondary to pneumonia over pulmonary embolism at this time.  11:53 AM: Consult with Dr. Ronaldo Miyamoto, patient accepted to hospitalist service. - Patient reassessed she is resting comfortably sitting up in bed no acute distress reports she is feeling better on supplemental oxygen no current complaints.  She is agreeable for admission she has no further complaints or concerns.   MAUD RUBENDALL was evaluated in Emergency Department on 11/10/2020 for the symptoms described in the history of present illness. She was evaluated in the context of the global COVID-19 pandemic, which necessitated consideration that the patient might be at risk for infection with the SARS-CoV-2 virus that causes COVID-19. Institutional protocols and algorithms that pertain to the evaluation of patients at risk for COVID-19 are in a state of rapid change based on information released by regulatory bodies including the CDC and federal and state organizations. These policies and algorithms were followed during the patient's care in the ED.  Note: Portions of this report may have been transcribed using voice recognition software. Every effort was made to ensure accuracy; however, inadvertent computerized transcription errors may still be present. Final Clinical Impression(s) / ED Diagnoses Final diagnoses:  Pneumonia due to COVID-19 virus  Acute respiratory failure with hypoxia [facility])    Rx / DC Orders ED Discharge Orders    None       Elizabeth Palau 11/10/20 1158    Melene Plan, DO 11/10/20 1336

## 2020-11-11 DIAGNOSIS — J1282 Pneumonia due to coronavirus disease 2019: Secondary | ICD-10-CM

## 2020-11-11 DIAGNOSIS — J9601 Acute respiratory failure with hypoxia: Secondary | ICD-10-CM

## 2020-11-11 LAB — HEPATITIS PANEL, ACUTE
HCV Ab: NONREACTIVE
Hep A IgM: NONREACTIVE
Hep B C IgM: NONREACTIVE
Hepatitis B Surface Ag: NONREACTIVE

## 2020-11-11 LAB — COMPREHENSIVE METABOLIC PANEL
ALT: 23 U/L (ref 0–44)
AST: 31 U/L (ref 15–41)
Albumin: 3.6 g/dL (ref 3.5–5.0)
Alkaline Phosphatase: 55 U/L (ref 38–126)
Anion gap: 7 (ref 5–15)
BUN: 8 mg/dL (ref 6–20)
CO2: 29 mmol/L (ref 22–32)
Calcium: 8.8 mg/dL — ABNORMAL LOW (ref 8.9–10.3)
Chloride: 101 mmol/L (ref 98–111)
Creatinine, Ser: 0.71 mg/dL (ref 0.44–1.00)
GFR, Estimated: >60 mL/min (ref >60)
Glucose, Bld: 174 mg/dL — ABNORMAL HIGH (ref 70–99)
Potassium: 4.1 mmol/L (ref 3.5–5.1)
Sodium: 137 mmol/L (ref 135–145)
Total Bilirubin: 0.4 mg/dL (ref 0.3–1.2)
Total Protein: 7.8 g/dL (ref 6.5–8.1)

## 2020-11-11 LAB — CBC WITH DIFFERENTIAL/PLATELET
Abs Immature Granulocytes: 0.01 10*3/uL (ref 0.00–0.07)
Basophils Absolute: 0 10*3/uL (ref 0.0–0.1)
Basophils Relative: 0 %
Eosinophils Absolute: 0 10*3/uL (ref 0.0–0.5)
Eosinophils Relative: 0 %
HCT: 37.4 % (ref 36.0–46.0)
Hemoglobin: 11.5 g/dL — ABNORMAL LOW (ref 12.0–15.0)
Immature Granulocytes: 0 %
Lymphocytes Relative: 20 %
Lymphs Abs: 0.8 10*3/uL (ref 0.7–4.0)
MCH: 23.9 pg — ABNORMAL LOW (ref 26.0–34.0)
MCHC: 30.7 g/dL (ref 30.0–36.0)
MCV: 77.6 fL — ABNORMAL LOW (ref 80.0–100.0)
Monocytes Absolute: 0.1 10*3/uL (ref 0.1–1.0)
Monocytes Relative: 3 %
Neutro Abs: 3.1 10*3/uL (ref 1.7–7.7)
Neutrophils Relative %: 77 %
Platelets: 314 10*3/uL (ref 150–400)
RBC: 4.82 MIL/uL (ref 3.87–5.11)
RDW: 16.7 % — ABNORMAL HIGH (ref 11.5–15.5)
WBC: 4 10*3/uL (ref 4.0–10.5)
nRBC: 0 % (ref 0.0–0.2)

## 2020-11-11 LAB — D-DIMER, QUANTITATIVE: D-Dimer, Quant: 0.63 ug/mL-FEU — ABNORMAL HIGH (ref 0.00–0.50)

## 2020-11-11 LAB — FERRITIN: Ferritin: 78 ng/mL (ref 11–307)

## 2020-11-11 LAB — C-REACTIVE PROTEIN: CRP: 14.1 mg/dL — ABNORMAL HIGH (ref <1.0)

## 2020-11-11 LAB — MAGNESIUM: Magnesium: 2.1 mg/dL (ref 1.7–2.4)

## 2020-11-11 MED ORDER — MONTELUKAST SODIUM 10 MG PO TABS
10.0000 mg | ORAL_TABLET | Freq: Every day | ORAL | Status: DC
  Administered 2020-11-11 – 2020-11-17 (×7): 10 mg via ORAL
  Filled 2020-11-11 (×7): qty 1

## 2020-11-11 MED ORDER — HYDROCHLOROTHIAZIDE 12.5 MG PO CAPS
12.5000 mg | ORAL_CAPSULE | Freq: Every day | ORAL | Status: DC
  Administered 2020-11-11 – 2020-11-18 (×8): 12.5 mg via ORAL
  Filled 2020-11-11 (×8): qty 1

## 2020-11-11 MED ORDER — LOSARTAN POTASSIUM 50 MG PO TABS
50.0000 mg | ORAL_TABLET | Freq: Every day | ORAL | Status: DC
  Administered 2020-11-11 – 2020-11-18 (×8): 50 mg via ORAL
  Filled 2020-11-11 (×8): qty 1

## 2020-11-11 MED ORDER — LOSARTAN POTASSIUM-HCTZ 50-12.5 MG PO TABS: 1.0000 | ORAL_TABLET | Freq: Every day | ORAL | Status: DC

## 2020-11-11 NOTE — Progress Notes (Signed)
PROGRESS NOTE    April Lewis  FAO:[id number] DOB: 11/29/1980 DOA: 11/10/2020 PCP: Gillian Scarce, MD     Brief Narrative:  April Lewis is a 40 y.o. BF PMHx MDD, Asthma, essential HTN, obesity,  Presenting with dyspnea and cough. Her symptoms started 5 days ago. They are worse with exertion. She tried several OTC meds to help, but they did not. She went to urgent care and was diagnosed w/ COVID 19. She was advised to go to the ED if symptoms worsened. Otherwise, she needed to self-isolate. Her symptoms progressed to add fevers and chills. She decided to come to the ED for help.   ED Course: CXR c/w COVID PNA. Started on remdes and decadron. CTA PE ordered. TRH called for admission.     Subjective: A/O x4, positive SOB, states not on home O2, not on CPAP   Assessment & Plan: Covid vaccination; unvaccinated   Active Problems:   COVID-19  Acute respiratory failure with hypoxia/COVID pneumonia COVID-19 Labs  Recent Labs    11/10/20 1040 11/11/20 0331  DDIMER 0.59* 0.63*  FERRITIN 78 78  LDH 287*  --   CRP 13.0* 14.1*    went to urgent care and was Dx w/ COVID 19  -Solu-Medrol 60 mg BID - Remdesivir pharmacy protocol - Vitamins per COVID protocol - Combivent QID  - Incentive spirometry - Flutter valve - Prone patient for 16 hours/day, if patient cannot tolerate prone for 2 to 3 hours per shift  Asthma - See COVID-pneumonia  Essential HTN - Hyzaar 50-12.5 mg daily  MDD  -Per MAR patient not taking medication    Morbidly obese - May benefit from weight loss program once recovered from COVID.  DVT prophylaxis: Lovenox Code Status: Full Family Communication:  Status is: Inpatient    Dispo: The patient is from: Home              Anticipated d/c is to: Home              Anticipated d/c date is: 1/27              Patient currently unstable      Consultants:    Procedures/Significant Events:    I have personally reviewed and  interpreted all radiology studies and my findings are as above.  VENTILATOR SETTINGS: Nasal cannula 1/22 Flow 2 L/min SPO2 94%   Cultures 1/21 blood left AC pending 1/21 blood RIGHT AC pending    Antimicrobials: Anti-infectives (From admission, onward)   Start     Ordered Stop   11/11/20 1000  remdesivir 100 mg in sodium chloride 0.9 % 100 mL IVPB       "Followed by" Linked Group Details   11/10/20 1034 11/15/20 0959   11/10/20 1200  remdesivir 200 mg in sodium chloride 0.9% 250 mL IVPB       "Followed by" Linked Group Details   11/10/20 1034 11/10/20 1226       Devices    LINES / TUBES:      Continuous Infusions: . remdesivir 100 mg in NS 100 mL 100 mg (11/11/20 1038)     Objective: Vitals:   11/10/20 2153 11/11/20 0203 11/11/20 0553 11/11/20 0909  BP: 127/88 128/83 126/84 129/78  Pulse: 96 82 86 87  Resp: 20 18 18 20   Temp: 99.1 F (37.3 C) 98.4 F (36.9 C) 98.4 F (36.9 C) 98.6 F (37 C)  TempSrc: Oral Oral Oral Oral  SpO2: 96% 96% 94% 96%  Weight: 110.5 kg     Height: 5\' 3"  (1.6 m)      No intake or output data in the 24 hours ending 11/11/20 1318 Filed Weights   11/10/20 1028 11/10/20 2153  Weight: 113.4 kg 110.5 kg    Examination:  General: A/O x4, positive acute respiratory distress Eyes: negative scleral hemorrhage, negative anisocoria, negative icterus ENT: Negative Runny nose, negative gingival bleeding, Neck:  Negative scars, masses, torticollis, lymphadenopathy, JVD Lungs: tachypneic decreased breath sounds bilaterally without wheezes or crackles Cardiovascular: Regular rate and rhythm without murmur gallop or rub normal S1 and S2 Abdomen: MORBIDLY OBESE, negative abdominal pain, nondistended, positive soft, bowel sounds, no rebound, no ascites, no appreciable mass Extremities: No significant cyanosis, clubbing, or edema bilateral lower extremities Skin: Negative rashes, lesions, ulcers Psychiatric:  Negative depression, negative  anxiety, negative fatigue, negative mania  Central nervous system:  Cranial nerves II through XII intact, tongue/uvula midline, all extremities muscle strength 5/5, sensation intact throughout, negative dysarthria, negative expressive aphasia, negative receptive aphasia.  .     Data Reviewed: Care during the described time interval was provided by me .  I have reviewed this patient's available data, including medical history, events of note, physical examination, and all test results as part of my evaluation.  CBC: Recent Labs  Lab 11/10/20 1040 11/11/20 0331  WBC 8.7 4.0  NEUTROABS 7.6 3.1  HGB 12.8 11.5*  HCT 40.7 37.4  MCV 76.9* 77.6*  PLT 300 314   Basic Metabolic Panel: Recent Labs  Lab 11/10/20 1040 11/11/20 0331  NA 134* 137  K 3.5 4.1  CL 97* 101  CO2 26 29  GLUCOSE 122* 174*  BUN 5* 8  CREATININE 0.90 0.71  CALCIUM 8.9 8.8*  MG  --  2.1   GFR: Estimated Creatinine Clearance: 112.7 mL/min (by C-G formula based on SCr of 0.71 mg/dL). Liver Function Tests: Recent Labs  Lab 11/10/20 1040 11/11/20 0331  AST 39 31  ALT 24 23  ALKPHOS 63 55  BILITOT 0.4 0.4  PROT 8.0 7.8  ALBUMIN 3.9 3.6   No results for input(s): LIPASE, AMYLASE in the last 168 hours. No results for input(s): AMMONIA in the last 168 hours. Coagulation Profile: No results for input(s): INR, PROTIME in the last 168 hours. Cardiac Enzymes: No results for input(s): CKTOTAL, CKMB, CKMBINDEX, TROPONINI in the last 168 hours. BNP (last 3 results) No results for input(s): PROBNP in the last 8760 hours. HbA1C: No results for input(s): HGBA1C in the last 72 hours. CBG: No results for input(s): GLUCAP in the last 168 hours. Lipid Profile: Recent Labs    11/10/20 1040  TRIG 115   Thyroid Function Tests: No results for input(s): TSH, T4TOTAL, FREET4, T3FREE, THYROIDAB in the last 72 hours. Anemia Panel: Recent Labs    11/10/20 1040 11/11/20 0331  FERRITIN 78 78   Sepsis Labs: Recent  Labs  Lab 11/10/20 1040 11/10/20 1326  PROCALCITON <0.10  --   LATICACIDVEN 2.2* 1.6    Recent Results (from the past 240 hour(s))  Blood Culture (routine x 2)     Status: None (Preliminary result)   Collection Time: 11/10/20 10:40 AM   Specimen: BLOOD  Result Value Ref Range Status   Specimen Description   Final    BLOOD LEFT ANTECUBITAL Performed at [facility], [address], [place], [place]    Special Requests   Final    BOTTLES DRAWN AEROBIC AND ANAEROBIC Blood Culture adequate volume Performed  at [facility], [address], [place], [place] [address]    Culture PENDING  Incomplete   Report Status PENDING  Incomplete  Blood Culture (routine x 2)     Status: None (Preliminary result)   Collection Time: 11/10/20 11:21 AM   Specimen: BLOOD  Result Value Ref Range Status   Specimen Description   Final    BLOOD RIGHT ANTECUBITAL Performed at [facility], [address] [place], [place] [address]    Special Requests   Final    BOTTLES DRAWN AEROBIC AND ANAEROBIC Blood Culture adequate volume Performed at [facility], [address] [place], [place] [address]    Culture PENDING  Incomplete   Report Status PENDING  Incomplete         Radiology Studies: CT Angio Chest PE W and/or Wo Contrast  Result Date: 11/10/2020 CLINICAL DATA:  PE suspected.  COVID-19 positive.  Hypoxic. EXAM: CT ANGIOGRAPHY CHEST WITH CONTRAST TECHNIQUE: Multidetector CT imaging of the chest was performed using the standard protocol during bolus administration of intravenous contrast. Multiplanar CT image reconstructions and MIPs were obtained to evaluate the vascular anatomy. CONTRAST:  OMNIPAQUE IOHEXOL 350 MG/ML SOLN COMPARISON:  Plain films, most recent 1 day prior. FINDINGS: Cardiovascular: The quality of this exam for evaluation of pulmonary embolism is poor to moderate. Limitations include patient body habitus and bolus  timing, centered in the SVC. No central or large lobar embolism. Smaller emboli cannot be exclude. Pulmonary artery enlargement, outflow tract 3.1 cm. Mediastinum/Nodes: No mediastinal or hilar adenopathy. Lungs/Pleura: No pleural fluid. Peripheral predominant airspace and less so ground-glass opacities bilaterally. Upper Abdomen: Moderate hepatic steatosis. Normal imaged portions of the spleen, stomach, pancreas, adrenal glands, kidneys. Musculoskeletal: No acute osseous abnormality. Review of the MIP images confirms the above findings. IMPRESSION: 1. Poor to moderate quality evaluation for pulmonary embolism. No central or large lobar embolism. Smaller emboli cannot be exclude. 2. Pulmonary artery enlargement suggests pulmonary arterial hypertension. 3. Peripheral predominant airspace and ground-glass opacities, consistent with COVID-19 pneumonia. 4. Hepatic steatosis. Electronically Signed   By: Jeronimo Greaves M.D.   On: 11/10/2020 13:25   DG Chest Port 1 View  Result Date: 11/10/2020 CLINICAL DATA:  Shortness of breath COVID positive EXAM: PORTABLE CHEST 1 VIEW COMPARISON:  Chest radiograph July 17, 2010 FINDINGS: The heart size and mediastinal contours are within normal limits. Low lung volumes. Left greater than right, peripheral predominant patchy opacities. No pleural effusion or pneumothorax. The visualized skeletal structures are unremarkable. IMPRESSION: Left greater than right, peripheral predominant patchy opacities compatible with COVID-19 pneumonia. Electronically Signed   By: Maudry Mayhew MD   On: 11/10/2020 11:22        Scheduled Meds: . vitamin C  500 mg Oral Daily  . enoxaparin (LOVENOX) injection  60 mg Subcutaneous Q24H  . Ipratropium-Albuterol  1 puff Inhalation Q6H  . methylPREDNISolone (SOLU-MEDROL) injection  60 mg Intravenous Q12H   Followed by  . [START ON 11/13/2020] predniSONE  50 mg Oral Daily  . zinc sulfate  220 mg Oral Daily   Continuous Infusions: .  remdesivir 100 mg in NS 100 mL 100 mg (11/11/20 1038)     LOS: 1 day    Time spent:40 min    WOODS, Roselind Messier, MD [facility] Pager [phone]  If 7PM-7AM, please contact night-coverage [email or web address] Password [email or web address] 11/11/2020, 1:18 PM

## 2020-11-11 NOTE — Plan of Care (Signed)
  Problem: Education: Goal: Knowledge of General Education information will improve Description Including pain rating scale, medication(s)/side effects and non-pharmacologic comfort measures Outcome: Progressing   Problem: Health Behavior/Discharge Planning: Goal: Ability to manage health-related needs will improve Outcome: Progressing   

## 2020-11-12 LAB — CBC WITH DIFFERENTIAL/PLATELET
Abs Immature Granulocytes: 0.04 10*3/uL (ref 0.00–0.07)
Basophils Absolute: 0 10*3/uL (ref 0.0–0.1)
Basophils Relative: 0 %
Eosinophils Absolute: 0 10*3/uL (ref 0.0–0.5)
Eosinophils Relative: 0 %
HCT: 38.2 % (ref 36.0–46.0)
Hemoglobin: 11.8 g/dL — ABNORMAL LOW (ref 12.0–15.0)
Immature Granulocytes: 0 %
Lymphocytes Relative: 17 %
Lymphs Abs: 1.5 10*3/uL (ref 0.7–4.0)
MCH: 24.2 pg — ABNORMAL LOW (ref 26.0–34.0)
MCHC: 30.9 g/dL (ref 30.0–36.0)
MCV: 78.3 fL — ABNORMAL LOW (ref 80.0–100.0)
Monocytes Absolute: 0.6 10*3/uL (ref 0.1–1.0)
Monocytes Relative: 7 %
Neutro Abs: 6.8 10*3/uL (ref 1.7–7.7)
Neutrophils Relative %: 76 %
Platelets: 407 10*3/uL — ABNORMAL HIGH (ref 150–400)
RBC: 4.88 MIL/uL (ref 3.87–5.11)
RDW: 16.7 % — ABNORMAL HIGH (ref 11.5–15.5)
WBC: 8.9 10*3/uL (ref 4.0–10.5)
nRBC: 0 % (ref 0.0–0.2)

## 2020-11-12 LAB — COMPREHENSIVE METABOLIC PANEL
ALT: 25 U/L (ref 0–44)
AST: 37 U/L (ref 15–41)
Albumin: 3.7 g/dL (ref 3.5–5.0)
Alkaline Phosphatase: 62 U/L (ref 38–126)
Anion gap: 11 (ref 5–15)
BUN: 12 mg/dL (ref 6–20)
CO2: 27 mmol/L (ref 22–32)
Calcium: 9.1 mg/dL (ref 8.9–10.3)
Chloride: 100 mmol/L (ref 98–111)
Creatinine, Ser: 0.77 mg/dL (ref 0.44–1.00)
GFR, Estimated: >60 mL/min (ref >60)
Glucose, Bld: 147 mg/dL — ABNORMAL HIGH (ref 70–99)
Potassium: 3.7 mmol/L (ref 3.5–5.1)
Sodium: 138 mmol/L (ref 135–145)
Total Bilirubin: 0.3 mg/dL (ref 0.3–1.2)
Total Protein: 7.7 g/dL (ref 6.5–8.1)

## 2020-11-12 LAB — LACTATE DEHYDROGENASE: LDH: 289 U/L — ABNORMAL HIGH (ref 98–192)

## 2020-11-12 LAB — MAGNESIUM: Magnesium: 2.1 mg/dL (ref 1.7–2.4)

## 2020-11-12 LAB — PHOSPHORUS: Phosphorus: 4.2 mg/dL (ref 2.5–4.6)

## 2020-11-12 LAB — C-REACTIVE PROTEIN: CRP: 5.9 mg/dL — ABNORMAL HIGH (ref <1.0)

## 2020-11-12 LAB — D-DIMER, QUANTITATIVE: D-Dimer, Quant: 0.84 ug/mL-FEU — ABNORMAL HIGH (ref 0.00–0.50)

## 2020-11-12 LAB — FERRITIN: Ferritin: 110 ng/mL (ref 11–307)

## 2020-11-12 NOTE — Progress Notes (Signed)
PROGRESS NOTE    IVALEE Lewis  April Lewis:[id number] DOB: Aug 13, 1981 DOA: 11/10/2020 PCP: Gillian Scarce, MD     Brief Narrative:  April Lewis is a 40 y.o. BF PMHx MDD, Asthma, essential HTN, obesity,  Presenting with dyspnea and cough. Her symptoms started 5 days ago. They are worse with exertion. She tried several OTC meds to help, but they did not. She went to urgent care and was diagnosed w/ COVID 19. She was advised to go to the ED if symptoms worsened. Otherwise, she needed to self-isolate. Her symptoms progressed to add fevers and chills. She decided to come to the ED for help.   ED Course: CXR c/w COVID PNA. Started on remdes and decadron. CTA PE ordered. TR[facility] called for admission.     Subjective: 1/23 afebrile overnight A/O x4, positive SOB   Assessment & Plan: Covid vaccination; unvaccinated   Active Problems:   COVID-19   Morbidly obese (HCC)  Acute respiratory failure with hypoxia/COVID pneumonia COVID-19 Labs  Recent Labs    11/10/20 1040 11/11/20 0331 11/12/20 0421  DDIMER 0.59* 0.63* 0.84*  FERRITIN 78 78 110  LDH 287*  --  289*  CRP 13.0* 14.1* 5.9*    went to urgent care and was Dx w/ COVID 19  -Solu-Medrol 60 mg BID - Remdesivir pharmacy protocol - Vitamins per COVID protocol - Combivent QID  - Incentive spirometry - Flutter valve - Prone patient for 16 hours/day, if patient cannot tolerate prone for 2 to 3 hours per shift  Asthma - See COVID-pneumonia  Essential HTN - Hyzaar 50-12.5 mg daily  MDD  -Per MAR patient not taking medication    Morbidly obese - May benefit from weight loss program once recovered from COVID.  DVT prophylaxis: Lovenox Code Status: Full Family Communication:  Status is: Inpatient    Dispo: The patient is from: Home              Anticipated d/c is to: Home              Anticipated d/c date is: 1/27              Patient currently unstable      Consultants:    Procedures/Significant  Events:    I have personally reviewed and interpreted all radiology studies and my findings are as above.  VENTILATOR SETTINGS: Nasal cannula 1/23 Flow 2 L/min SPO2 94%   Cultures 1/21 blood left AC pending 1/21 blood RIGHT AC pending    Antimicrobials: Anti-infectives (From admission, onward)   Start     Ordered Stop   11/11/20 1000  remdesivir 100 mg in sodium chloride 0.9 % 100 mL IVPB       "Followed by" Linked Group Details   11/10/20 1034 11/15/20 0959   11/10/20 1200  remdesivir 200 mg in sodium chloride 0.9% 250 mL IVPB       "Followed by" Linked Group Details   11/10/20 1034 11/10/20 1226       Devices    LINES / TUBES:      Continuous Infusions: . remdesivir 100 mg in NS 100 mL 100 mg (11/12/20 0925)     Objective: Vitals:   11/11/20 0909 11/11/20 1427 11/11/20 2123 11/12/20 0649  BP: 129/78 (!) 145/90 137/77 133/86  Pulse: 87 95 100 80  Resp: 20 18 18 20   Temp: 98.6 F (37 C) 98.5 F (36.9 C) 98.4 F (36.9 C) 97.7 F (36.5 C)  TempSrc: Oral Oral Oral  Oral  SpO2: 96% 93% 95% (!) 89%  Weight:      Height:        Intake/Output Summary (Last 24 hours) at 11/12/2020 1325 Last data filed at 11/12/2020 7619 Gross per 24 hour  Intake 0 ml  Output --  Net 0 ml   Filed Weights   11/10/20 1028 11/10/20 2153  Weight: 113.4 kg 110.5 kg    Examination:  General: A/O x4, positive acute respiratory distress Eyes: negative scleral hemorrhage, negative anisocoria, negative icterus ENT: Negative Runny nose, negative gingival bleeding, Neck:  Negative scars, masses, torticollis, lymphadenopathy, JVD Lungs: tachypneic decreased breath sounds bilaterally without wheezes or crackles Cardiovascular: Regular rate and rhythm without murmur gallop or rub normal S1 and S2 Abdomen: MORBIDLY OBESE, negative abdominal pain, nondistended, positive soft, bowel sounds, no rebound, no ascites, no appreciable mass Extremities: No significant cyanosis, clubbing,  or edema bilateral lower extremities Skin: Negative rashes, lesions, ulcers Psychiatric:  Negative depression, negative anxiety, negative fatigue, negative mania  Central nervous system:  Cranial nerves II through XII intact, tongue/uvula midline, all extremities muscle strength 5/5, sensation intact throughout, negative dysarthria, negative expressive aphasia, negative receptive aphasia.  .     Data Reviewed: Care during the described time interval was provided by me .  I have reviewed this patient's available data, including medical history, events of note, physical examination, and all test results as part of my evaluation.  CBC: Recent Labs  Lab 11/10/20 1040 11/11/20 0331 11/12/20 0421  WBC 8.7 4.0 8.9  NEUTROABS 7.6 3.1 6.8  HGB 12.8 11.5* 11.8*  HCT 40.7 37.4 38.2  MCV 76.9* 77.6* 78.3*  PLT 300 314 407*   Basic Metabolic Panel: Recent Labs  Lab 11/10/20 1040 11/11/20 0331 11/12/20 0421  NA 134* 137 138  K 3.5 4.1 3.7  CL 97* 101 100  CO2 26 29 27   GLUCOSE 122* 174* 147*  BUN 5* 8 12  CREATININE 0.90 0.71 0.77  CALCIUM 8.9 8.8* 9.1  MG  --  2.1 2.1  PHOS  --   --  4.2   GFR: Estimated Creatinine Clearance: 112.7 mL/min (by C-G formula based on SCr of 0.77 mg/dL). Liver Function Tests: Recent Labs  Lab 11/10/20 1040 11/11/20 0331 11/12/20 0421  AST 39 31 37  ALT 24 23 25   ALKPHOS 63 55 62  BILITOT 0.4 0.4 0.3  PROT 8.0 7.8 7.7  ALBUMIN 3.9 3.6 3.7   No results for input(s): LIPASE, AMYLASE in the last 168 hours. No results for input(s): AMMONIA in the last 168 hours. Coagulation Profile: No results for input(s): INR, PROTIME in the last 168 hours. Cardiac Enzymes: No results for input(s): CKTOTAL, CKMB, CKMBINDEX, TROPONINI in the last 168 hours. BNP (last 3 results) No results for input(s): PROBNP in the last 8760 hours. HbA1C: No results for input(s): HGBA1C in the last 72 hours. CBG: No results for input(s): GLUCAP in the last 168  hours. Lipid Profile: Recent Labs    11/10/20 1040  TRIG 115   Thyroid Function Tests: No results for input(s): TSH, T4TOTAL, FREET4, T3FREE, THYROIDAB in the last 72 hours. Anemia Panel: Recent Labs    11/11/20 0331 11/12/20 0421  FERRITIN 78 110   Sepsis Labs: Recent Labs  Lab 11/10/20 1040 11/10/20 1326  PROCALCITON <0.10  --   LATICACIDVEN 2.2* 1.6    Recent Results (from the past 240 hour(s))  Blood Culture (routine x 2)     Status: None (Preliminary result)   Collection  Time: 11/10/20 10:40 AM   Specimen: BLOOD  Result Value Ref Range Status   Specimen Description   Final    BLOOD LEFT ANTECUBITAL Performed at [facility], [address] [place], [place] [address]    Special Requests   Final    BOTTLES DRAWN AEROBIC AND ANAEROBIC Blood Culture adequate volume Performed at [facility], [address] [place], [place] [address]    Culture   Final    NO GROWTH 2 DAYS Performed at [facility], [address] [place], [place] [address]    Report Status PENDING  Incomplete  Blood Culture (routine x 2)     Status: None (Preliminary result)   Collection Time: 11/10/20 11:21 AM   Specimen: BLOOD  Result Value Ref Range Status   Specimen Description   Final    BLOOD RIGHT ANTECUBITAL Performed at [facility], [address], [place], [place] [address]    Special Requests   Final    BOTTLES DRAWN AEROBIC AND ANAEROBIC Blood Culture adequate volume Performed at [facility], [address], [place], [place] [address]    Culture   Final    NO GROWTH 2 DAYS Performed at [facility], [address] [place], [place] [address]    Report Status PENDING  Incomplete         Radiology Studies: No results found.      Scheduled Meds: . vitamin C  500 mg Oral Daily  . enoxaparin (LOVENOX) injection  60 mg Subcutaneous Q24H  . losartan  50 mg Oral Daily   And  . hydrochlorothiazide  12.5  mg Oral Daily  . Ipratropium-Albuterol  1 puff Inhalation Q6H  . methylPREDNISolone (SOLU-MEDROL) injection  60 mg Intravenous Q12H   Followed by  . [START ON 11/13/2020] predniSONE  50 mg Oral Daily  . montelukast  10 mg Oral QHS  . zinc sulfate  220 mg Oral Daily   Continuous Infusions: . remdesivir 100 mg in NS 100 mL 100 mg (11/12/20 0925)     LOS: 2 days    Time spent:40 min    Londen Bok, Roselind Messier, MD [facility] Pager [phone]  If 7PM-7AM, please contact night-coverage [email or web address] Password [email or web address] 11/12/2020, 1:25 PM

## 2020-11-13 LAB — COMPREHENSIVE METABOLIC PANEL
ALT: 25 U/L (ref 0–44)
AST: 30 U/L (ref 15–41)
Albumin: 3.5 g/dL (ref 3.5–5.0)
Alkaline Phosphatase: 61 U/L (ref 38–126)
Anion gap: 11 (ref 5–15)
BUN: 13 mg/dL (ref 6–20)
CO2: 28 mmol/L (ref 22–32)
Calcium: 8.7 mg/dL — ABNORMAL LOW (ref 8.9–10.3)
Chloride: 99 mmol/L (ref 98–111)
Creatinine, Ser: 0.69 mg/dL (ref 0.44–1.00)
GFR, Estimated: >60 mL/min (ref >60)
Glucose, Bld: 162 mg/dL — ABNORMAL HIGH (ref 70–99)
Potassium: 3.8 mmol/L (ref 3.5–5.1)
Sodium: 138 mmol/L (ref 135–145)
Total Bilirubin: 0.4 mg/dL (ref 0.3–1.2)
Total Protein: 7.3 g/dL (ref 6.5–8.1)

## 2020-11-13 LAB — CBC WITH DIFFERENTIAL/PLATELET
Abs Immature Granulocytes: 0.06 10*3/uL (ref 0.00–0.07)
Basophils Absolute: 0 10*3/uL (ref 0.0–0.1)
Basophils Relative: 0 %
Eosinophils Absolute: 0 10*3/uL (ref 0.0–0.5)
Eosinophils Relative: 0 %
HCT: 35 % — ABNORMAL LOW (ref 36.0–46.0)
Hemoglobin: 11.2 g/dL — ABNORMAL LOW (ref 12.0–15.0)
Immature Granulocytes: 1 %
Lymphocytes Relative: 16 %
Lymphs Abs: 1.8 10*3/uL (ref 0.7–4.0)
MCH: 24.5 pg — ABNORMAL LOW (ref 26.0–34.0)
MCHC: 32 g/dL (ref 30.0–36.0)
MCV: 76.4 fL — ABNORMAL LOW (ref 80.0–100.0)
Monocytes Absolute: 0.6 10*3/uL (ref 0.1–1.0)
Monocytes Relative: 5 %
Neutro Abs: 9.2 10*3/uL — ABNORMAL HIGH (ref 1.7–7.7)
Neutrophils Relative %: 78 %
Platelets: 437 10*3/uL — ABNORMAL HIGH (ref 150–400)
RBC: 4.58 MIL/uL (ref 3.87–5.11)
RDW: 16.6 % — ABNORMAL HIGH (ref 11.5–15.5)
WBC: 11.6 10*3/uL — ABNORMAL HIGH (ref 4.0–10.5)
nRBC: 0 % (ref 0.0–0.2)

## 2020-11-13 LAB — PHOSPHORUS: Phosphorus: 4.5 mg/dL (ref 2.5–4.6)

## 2020-11-13 LAB — D-DIMER, QUANTITATIVE: D-Dimer, Quant: 1.12 ug/mL-FEU — ABNORMAL HIGH (ref 0.00–0.50)

## 2020-11-13 LAB — MAGNESIUM: Magnesium: 2.2 mg/dL (ref 1.7–2.4)

## 2020-11-13 LAB — FERRITIN: Ferritin: 75 ng/mL (ref 11–307)

## 2020-11-13 LAB — LACTATE DEHYDROGENASE: LDH: 281 U/L — ABNORMAL HIGH (ref 98–192)

## 2020-11-13 LAB — C-REACTIVE PROTEIN: CRP: 2 mg/dL — ABNORMAL HIGH (ref <1.0)

## 2020-11-13 NOTE — Progress Notes (Signed)
PROGRESS NOTE    April Lewis  UDJ:[id number] DOB: Dec 03, 1980 DOA: 11/10/2020 PCP: Gillian Scarce, MD     Brief Narrative:  April Lewis is a 40 y.o. BF PMHx MDD, Asthma, essential HTN, obesity,  Presenting with dyspnea and cough. Her symptoms started 5 days ago. They are worse with exertion. She tried several OTC meds to help, but they did not. She went to urgent care and was diagnosed w/ COVID 19. She was advised to go to the ED if symptoms worsened. Otherwise, she needed to self-isolate. Her symptoms progressed to add fevers and chills. She decided to come to the ED for help.   ED Course: CXR c/w COVID PNA. Started on remdes and decadron. CTA PE ordered. TRH called for admission.     Subjective: 1/24 afebrile overnight, A/O x4, positive S OB.  Comfortably sitting in bed.  Has not been in chair today.   Assessment & Plan: Covid vaccination; unvaccinated   Active Problems:   COVID-19   Morbidly obese (HCC)  Acute respiratory failure with hypoxia/COVID pneumonia COVID-19 Labs  Recent Labs    11/11/20 0331 11/12/20 0421 11/13/20 0424  DDIMER 0.63* 0.84* 1.12*  FERRITIN 78 110 75  LDH  --  289* 281*  CRP 14.1* 5.9* 2.0*    went to urgent care and was Dx w/ COVID 19  -Solu-Medrol 60 mg BID - Remdesivir pharmacy protocol - Vitamins per COVID protocol - Combivent QID  - Incentive spirometry - Flutter valve - Prone patient for 16 hours/day, if patient cannot tolerate prone for 2 to 3 hours per shift -1/24 encourage patient that she must be in the chair least once a day.  Asthma - See COVID-pneumonia  Essential HTN - Hyzaar 50-12.5 mg daily  MDD  -Per MAR patient not taking medication    Morbidly obese - May benefit from weight loss program once recovered from COVID.  DVT prophylaxis: Lovenox Code Status: Full Family Communication:  Status is: Inpatient    Dispo: The patient is from: Home              Anticipated d/c is to: Home               Anticipated d/c date is: 1/27              Patient currently unstable      Consultants:    Procedures/Significant Events:    I have personally reviewed and interpreted all radiology studies and my findings are as above.  VENTILATOR SETTINGS: Nasal cannula 1/24 Flow 2 L/min SPO2 94%   Cultures 1/21 blood left AC pending 1/21 blood RIGHT AC pending    Antimicrobials: Anti-infectives (From admission, onward)   Start     Ordered Stop   11/11/20 1000  remdesivir 100 mg in sodium chloride 0.9 % 100 mL IVPB       "Followed by" Linked Group Details   11/10/20 1034 11/15/20 0959   11/10/20 1200  remdesivir 200 mg in sodium chloride 0.9% 250 mL IVPB       "Followed by" Linked Group Details   11/10/20 1034 11/10/20 1226       Devices    LINES / TUBES:      Continuous Infusions: . remdesivir 100 mg in NS 100 mL 100 mg (11/13/20 1012)     Objective: Vitals:   11/12/20 1335 11/12/20 2011 11/13/20 0449 11/13/20 1312  BP: 124/76  120/63 127/76  Pulse: 85 92 73 74  Resp: 16  17 18 18   Temp: 98.4 F (36.9 C) 98.5 F (36.9 C) 98.2 F (36.8 C) (!) 97.5 F (36.4 C)  TempSrc: Oral Oral Oral Oral  SpO2: (!) 89% 93% 91%   Weight:      Height:       No intake or output data in the 24 hours ending 11/13/20 1451 Filed Weights   11/10/20 1028 11/10/20 2153  Weight: 113.4 kg 110.5 kg    Examination:  General: A/O x4, positive acute respiratory distress Eyes: negative scleral hemorrhage, negative anisocoria, negative icterus ENT: Negative Runny nose, negative gingival bleeding, Neck:  Negative scars, masses, torticollis, lymphadenopathy, JVD Lungs: tachypneic decreased breath sounds bilaterally without wheezes or crackles Cardiovascular: Regular rate and rhythm without murmur gallop or rub normal S1 and S2 Abdomen: MORBIDLY OBESE, negative abdominal pain, nondistended, positive soft, bowel sounds, no rebound, no ascites, no appreciable mass Extremities: No  significant cyanosis, clubbing, or edema bilateral lower extremities Skin: Negative rashes, lesions, ulcers Psychiatric:  Negative depression, negative anxiety, negative fatigue, negative mania  Central nervous system:  Cranial nerves II through XII intact, tongue/uvula midline, all extremities muscle strength 5/5, sensation intact throughout, negative dysarthria, negative expressive aphasia, negative receptive aphasia.  .     Data Reviewed: Care during the described time interval was provided by me .  I have reviewed this patient's available data, including medical history, events of note, physical examination, and all test results as part of my evaluation.  CBC: Recent Labs  Lab 11/10/20 1040 11/11/20 0331 11/12/20 0421 11/13/20 0424  WBC 8.7 4.0 8.9 11.6*  NEUTROABS 7.6 3.1 6.8 9.2*  HGB 12.8 11.5* 11.8* 11.2*  HCT 40.7 37.4 38.2 35.0*  MCV 76.9* 77.6* 78.3* 76.4*  PLT 300 314 407* 437*   Basic Metabolic Panel: Recent Labs  Lab 11/10/20 1040 11/11/20 0331 11/12/20 0421 11/13/20 0424  NA 134* 137 138 138  K 3.5 4.1 3.7 3.8  CL 97* 101 100 99  CO2 26 29 27 28   GLUCOSE 122* 174* 147* 162*  BUN 5* 8 12 13   CREATININE 0.90 0.71 0.77 0.69  CALCIUM 8.9 8.8* 9.1 8.7*  MG  --  2.1 2.1 2.2  PHOS  --   --  4.2 4.5   GFR: Estimated Creatinine Clearance: 112.7 mL/min (by C-G formula based on SCr of 0.69 mg/dL). Liver Function Tests: Recent Labs  Lab 11/10/20 1040 11/11/20 0331 11/12/20 0421 11/13/20 0424  AST 39 31 37 30  ALT 24 23 25 25   ALKPHOS 63 55 62 61  BILITOT 0.4 0.4 0.3 0.4  PROT 8.0 7.8 7.7 7.3  ALBUMIN 3.9 3.6 3.7 3.5   No results for input(s): LIPASE, AMYLASE in the last 168 hours. No results for input(s): AMMONIA in the last 168 hours. Coagulation Profile: No results for input(s): INR, PROTIME in the last 168 hours. Cardiac Enzymes: No results for input(s): CKTOTAL, CKMB, CKMBINDEX, TROPONINI in the last 168 hours. BNP (last 3 results) No results  for input(s): PROBNP in the last 8760 hours. HbA1C: No results for input(s): HGBA1C in the last 72 hours. CBG: No results for input(s): GLUCAP in the last 168 hours. Lipid Profile: No results for input(s): CHOL, HDL, LDLCALC, TRIG, CHOLHDL, LDLDIRECT in the last 72 hours. Thyroid Function Tests: No results for input(s): TSH, T4TOTAL, FREET4, T3FREE, THYROIDAB in the last 72 hours. Anemia Panel: Recent Labs    11/12/20 0421 11/13/20 0424  FERRITIN 110 75   Sepsis Labs: Recent Labs  Lab 11/10/20 1040 11/10/20  1326  PROCALCITON <0.10  --   LATICACIDVEN 2.2* 1.6    Recent Results (from the past 240 hour(s))  Blood Culture (routine x 2)     Status: None (Preliminary result)   Collection Time: 11/10/20 10:40 AM   Specimen: BLOOD  Result Value Ref Range Status   Specimen Description   Final    BLOOD LEFT ANTECUBITAL Performed at [facility], [address], [place], [place] [address]    Special Requests   Final    BOTTLES DRAWN AEROBIC AND ANAEROBIC Blood Culture adequate volume Performed at [facility], [address], [place], [place] [address]    Culture   Final    NO GROWTH 2 DAYS Performed at [facility], [address] [place], [place] [address]    Report Status PENDING  Incomplete  Blood Culture (routine x 2)     Status: None (Preliminary result)   Collection Time: 11/10/20 11:21 AM   Specimen: BLOOD  Result Value Ref Range Status   Specimen Description   Final    BLOOD RIGHT ANTECUBITAL Performed at [facility], [address], [place], [place] [address]    Special Requests   Final    BOTTLES DRAWN AEROBIC AND ANAEROBIC Blood Culture adequate volume Performed at [facility], [address], [place], [place] [address]    Culture   Final    NO GROWTH 2 DAYS Performed at [facility], [address], [place], [place] [address]    Report Status PENDING  Incomplete         Radiology  Studies: No results found.      Scheduled Meds: . vitamin C  500 mg Oral Daily  . enoxaparin (LOVENOX) injection  60 mg Subcutaneous Q24H  . losartan  50 mg Oral Daily   And  . hydrochlorothiazide  12.5 mg Oral Daily  . Ipratropium-Albuterol  1 puff Inhalation Q6H  . montelukast  10 mg Oral QHS  . predniSONE  50 mg Oral Daily  . zinc sulfate  220 mg Oral Daily   Continuous Infusions: . remdesivir 100 mg in NS 100 mL 100 mg (11/13/20 1012)     LOS: 3 days    Time spent:40 min    Avrom Robarts, Roselind Messier, MD [facility] Pager [phone]  If 7PM-7AM, please contact night-coverage [email or web address] Password [facility] 11/13/2020, 2:51 PM

## 2020-11-14 LAB — COMPREHENSIVE METABOLIC PANEL
ALT: 29 U/L (ref 0–44)
AST: 30 U/L (ref 15–41)
Albumin: 3.6 g/dL (ref 3.5–5.0)
Alkaline Phosphatase: 62 U/L (ref 38–126)
Anion gap: 13 (ref 5–15)
BUN: 12 mg/dL (ref 6–20)
CO2: 25 mmol/L (ref 22–32)
Calcium: 9.1 mg/dL (ref 8.9–10.3)
Chloride: 99 mmol/L (ref 98–111)
Creatinine, Ser: 0.82 mg/dL (ref 0.44–1.00)
GFR, Estimated: >60 mL/min (ref >60)
Glucose, Bld: 143 mg/dL — ABNORMAL HIGH (ref 70–99)
Potassium: 3.8 mmol/L (ref 3.5–5.1)
Sodium: 137 mmol/L (ref 135–145)
Total Bilirubin: 0.4 mg/dL (ref 0.3–1.2)
Total Protein: 7.3 g/dL (ref 6.5–8.1)

## 2020-11-14 LAB — CBC WITH DIFFERENTIAL/PLATELET
Abs Immature Granulocytes: 0.17 10*3/uL — ABNORMAL HIGH (ref 0.00–0.07)
Basophils Absolute: 0 10*3/uL (ref 0.0–0.1)
Basophils Relative: 0 %
Eosinophils Absolute: 0 10*3/uL (ref 0.0–0.5)
Eosinophils Relative: 0 %
HCT: 36.8 % (ref 36.0–46.0)
Hemoglobin: 11.5 g/dL — ABNORMAL LOW (ref 12.0–15.0)
Immature Granulocytes: 1 %
Lymphocytes Relative: 18 %
Lymphs Abs: 2.4 10*3/uL (ref 0.7–4.0)
MCH: 24.1 pg — ABNORMAL LOW (ref 26.0–34.0)
MCHC: 31.3 g/dL (ref 30.0–36.0)
MCV: 77 fL — ABNORMAL LOW (ref 80.0–100.0)
Monocytes Absolute: 0.9 10*3/uL (ref 0.1–1.0)
Monocytes Relative: 7 %
Neutro Abs: 9.7 10*3/uL — ABNORMAL HIGH (ref 1.7–7.7)
Neutrophils Relative %: 74 %
Platelets: 507 10*3/uL — ABNORMAL HIGH (ref 150–400)
RBC: 4.78 MIL/uL (ref 3.87–5.11)
RDW: 16.6 % — ABNORMAL HIGH (ref 11.5–15.5)
WBC: 13.2 10*3/uL — ABNORMAL HIGH (ref 4.0–10.5)
nRBC: 0 % (ref 0.0–0.2)

## 2020-11-14 LAB — LACTATE DEHYDROGENASE: LDH: 279 U/L — ABNORMAL HIGH (ref 98–192)

## 2020-11-14 LAB — MAGNESIUM: Magnesium: 2.5 mg/dL — ABNORMAL HIGH (ref 1.7–2.4)

## 2020-11-14 LAB — FERRITIN: Ferritin: 60 ng/mL (ref 11–307)

## 2020-11-14 LAB — C-REACTIVE PROTEIN: CRP: 1.2 mg/dL — ABNORMAL HIGH (ref <1.0)

## 2020-11-14 LAB — D-DIMER, QUANTITATIVE: D-Dimer, Quant: 0.98 ug/mL-FEU — ABNORMAL HIGH (ref 0.00–0.50)

## 2020-11-14 LAB — PHOSPHORUS: Phosphorus: 3.9 mg/dL (ref 2.5–4.6)

## 2020-11-14 MED ORDER — LORATADINE 10 MG PO TABS
10.0000 mg | ORAL_TABLET | Freq: Every day | ORAL | Status: DC
  Administered 2020-11-14 – 2020-11-18 (×3): 10 mg via ORAL
  Filled 2020-11-14 (×5): qty 1

## 2020-11-14 NOTE — Progress Notes (Signed)
PROGRESS NOTE    MAHI ZABRISKIE  OEV:[id number] DOB: 07-18-1981 DOA: 11/10/2020 PCP: Gillian Scarce, MD     Brief Narrative:  April Lewis is a 40 y.o. BF PMHx MDD, Asthma, essential HTN, obesity,  Presenting with dyspnea and cough. Her symptoms started 5 days ago. They are worse with exertion. She tried several OTC meds to help, but they did not. She went to urgent care and was diagnosed w/ COVID 19. She was advised to go to the ED if symptoms worsened. Otherwise, she needed to self-isolate. Her symptoms progressed to add fevers and chills. She decided to come to the ED for help.   ED Course: CXR c/w COVID PNA. Started on remdes and decadron. CTA PE ordered. TRH called for admission.     Subjective: 1/25 afebrile overnight A/O x4, continued S OB    afebrile overnight, A/O x4, positive S OB.  Comfortably sitting in bed.  Has not been in chair today.   Assessment & Plan: Covid vaccination; unvaccinated   Active Problems:   COVID-19   Morbidly obese (HCC)  Acute respiratory failure with hypoxia/COVID pneumonia COVID-19 Labs  Recent Labs    11/12/20 0421 11/13/20 0424 11/14/20 0443  DDIMER 0.84* 1.12* 0.98*  FERRITIN 110 75 60  LDH 289* 281* 279*  CRP 5.9* 2.0* 1.2*    went to urgent care and was Dx w/ COVID 19  -Solu-Medrol 60 mg BID---> Prednisone 50 mg daily - Remdesivir pharmacy protocol (complete) - Vitamins per COVID protocol - Combivent QID  - Incentive spirometry - Flutter valve - Prone patient for 16 hours/day, if patient cannot tolerate prone for 2 to 3 hours per shift -1/24 encourage patient that she must be in the chair least once a day.  Asthma - See COVID-pneumonia  Essential HTN - Hyzaar 50-12.5 mg daily  MDD  -Per MAR patient not taking medication    Morbidly obese - May benefit from weight loss program once recovered from COVID.   DVT prophylaxis: Lovenox Code Status: Full Family Communication:  Status is:  Inpatient    Dispo: The patient is from: Home              Anticipated d/c is to: Home              Anticipated d/c date is: 1/26              Patient currently unstable      Consultants:    Procedures/Significant Events:    I have personally reviewed and interpreted all radiology studies and my findings are as above.  VENTILATOR SETTINGS: Nasal cannula 1/25 Flow 2 L/min SPO2 94%   Cultures 1/21 blood left AC NGTD 1/21 blood RIGHT AC NGTD    Antimicrobials: Anti-infectives (From admission, onward)   Start     Ordered Stop   11/11/20 1000  remdesivir 100 mg in sodium chloride 0.9 % 100 mL IVPB       "Followed by" Linked Group Details   11/10/20 1034 11/15/20 0959   11/10/20 1200  remdesivir 200 mg in sodium chloride 0.9% 250 mL IVPB       "Followed by" Linked Group Details   11/10/20 1034 11/10/20 1226       Devices    LINES / TUBES:      Continuous Infusions:    Objective: Vitals:   11/13/20 0449 11/13/20 1312 11/13/20 2040 11/14/20 0421  BP: 120/63 127/76 117/73 114/70  Pulse: 73 74 71 70  Resp:  18 18 18 20   Temp: 98.2 F (36.8 C) (!) 97.5 F (36.4 C) 97.7 F (36.5 C) (!) 97.5 F (36.4 C)  TempSrc: Oral Oral Oral Oral  SpO2: 91% 92% 90% 90%  Weight:      Height:       No intake or output data in the 24 hours ending 11/14/20 1209 Filed Weights   11/10/20 1028 11/10/20 2153  Weight: 113.4 kg 110.5 kg    Examination:  General: A/O x4, positive acute respiratory distress Eyes: negative scleral hemorrhage, negative anisocoria, negative icterus ENT: Negative Runny nose, negative gingival bleeding, Neck:  Negative scars, masses, torticollis, lymphadenopathy, JVD Lungs: tachypneic decreased breath sounds bilaterally without wheezes or crackles Cardiovascular: Regular rate and rhythm without murmur gallop or rub normal S1 and S2 Abdomen: MORBIDLY OBESE, negative abdominal pain, nondistended, positive soft, bowel sounds, no rebound, no  ascites, no appreciable mass Extremities: No significant cyanosis, clubbing, or edema bilateral lower extremities Skin: Negative rashes, lesions, ulcers Psychiatric:  Negative depression, negative anxiety, negative fatigue, negative mania  Central nervous system:  Cranial nerves II through XII intact, tongue/uvula midline, all extremities muscle strength 5/5, sensation intact throughout, negative dysarthria, negative expressive aphasia, negative receptive aphasia.  .     Data Reviewed: Care during the described time interval was provided by me .  I have reviewed this patient's available data, including medical history, events of note, physical examination, and all test results as part of my evaluation.  CBC: Recent Labs  Lab 11/10/20 1040 11/11/20 0331 11/12/20 0421 11/13/20 0424 11/14/20 0443  WBC 8.7 4.0 8.9 11.6* 13.2*  NEUTROABS 7.6 3.1 6.8 9.2* 9.7*  HGB 12.8 11.5* 11.8* 11.2* 11.5*  HCT 40.7 37.4 38.2 35.0* 36.8  MCV 76.9* 77.6* 78.3* 76.4* 77.0*  PLT 300 314 407* 437* 507*   Basic Metabolic Panel: Recent Labs  Lab 11/10/20 1040 11/11/20 0331 11/12/20 0421 11/13/20 0424 11/14/20 0443  NA 134* 137 138 138 137  K 3.5 4.1 3.7 3.8 3.8  CL 97* 101 100 99 99  CO2 26 29 27 28 25   GLUCOSE 122* 174* 147* 162* 143*  BUN 5* 8 12 13 12   CREATININE 0.90 0.71 0.77 0.69 0.82  CALCIUM 8.9 8.8* 9.1 8.7* 9.1  MG  --  2.1 2.1 2.2 2.5*  PHOS  --   --  4.2 4.5 3.9   GFR: Estimated Creatinine Clearance: 109.9 mL/min (by C-G formula based on SCr of 0.82 mg/dL). Liver Function Tests: Recent Labs  Lab 11/10/20 1040 11/11/20 0331 11/12/20 0421 11/13/20 0424 11/14/20 0443  AST 39 31 37 30 30  ALT 24 23 25 25 29   ALKPHOS 63 55 62 61 62  BILITOT 0.4 0.4 0.3 0.4 0.4  PROT 8.0 7.8 7.7 7.3 7.3  ALBUMIN 3.9 3.6 3.7 3.5 3.6   No results for input(s): LIPASE, AMYLASE in the last 168 hours. No results for input(s): AMMONIA in the last 168 hours. Coagulation Profile: No results for  input(s): INR, PROTIME in the last 168 hours. Cardiac Enzymes: No results for input(s): CKTOTAL, CKMB, CKMBINDEX, TROPONINI in the last 168 hours. BNP (last 3 results) No results for input(s): PROBNP in the last 8760 hours. HbA1C: No results for input(s): HGBA1C in the last 72 hours. CBG: No results for input(s): GLUCAP in the last 168 hours. Lipid Profile: No results for input(s): CHOL, HDL, LDLCALC, TRIG, CHOLHDL, LDLDIRECT in the last 72 hours. Thyroid Function Tests: No results for input(s): TSH, T4TOTAL, FREET4, T3FREE, THYROIDAB in the  last 72 hours. Anemia Panel: Recent Labs    11/13/20 0424 11/14/20 0443  FERRITIN 75 60   Sepsis Labs: Recent Labs  Lab 11/10/20 1040 11/10/20 1326  PROCALCITON <0.10  --   LATICACIDVEN 2.2* 1.6    Recent Results (from the past 240 hour(s))  Blood Culture (routine x 2)     Status: None (Preliminary result)   Collection Time: 11/10/20 10:40 AM   Specimen: BLOOD  Result Value Ref Range Status   Specimen Description   Final    BLOOD LEFT ANTECUBITAL Performed at [facility], [address], [place], [place] [address]    Special Requests   Final    BOTTLES DRAWN AEROBIC AND ANAEROBIC Blood Culture adequate volume Performed at [facility], [address] [place], [place] [address]    Culture   Final    NO GROWTH 3 DAYS Performed at [facility], [address], [place], [place] [address]    Report Status PENDING  Incomplete  Blood Culture (routine x 2)     Status: None (Preliminary result)   Collection Time: 11/10/20 11:21 AM   Specimen: BLOOD  Result Value Ref Range Status   Specimen Description   Final    BLOOD RIGHT ANTECUBITAL Performed at [facility], [address] [place], [place] [address]    Special Requests   Final    BOTTLES DRAWN AEROBIC AND ANAEROBIC Blood Culture adequate volume Performed at [facility], [address], [place], [place] [address]     Culture   Final    NO GROWTH 3 DAYS Performed at [facility], [address] [place], [place] [address]    Report Status PENDING  Incomplete         Radiology Studies: No results found.      Scheduled Meds: . vitamin C  500 mg Oral Daily  . enoxaparin (LOVENOX) injection  60 mg Subcutaneous Q24H  . losartan  50 mg Oral Daily   And  . hydrochlorothiazide  12.5 mg Oral Daily  . Ipratropium-Albuterol  1 puff Inhalation Q6H  . montelukast  10 mg Oral QHS  . predniSONE  50 mg Oral Daily  . zinc sulfate  220 mg Oral Daily   Continuous Infusions:    LOS: 4 days    Time spent:40 min    Thayer Embleton, Roselind Messier, MD [facility] Pager [phone]  If 7PM-7AM, please contact night-coverage [email or web address] Password [facility] 11/14/2020, 12:09 PM

## 2020-11-14 NOTE — Plan of Care (Signed)

## 2020-11-14 NOTE — Progress Notes (Signed)
SATURATION QUALIFICATIONS: (This note is used to comply with regulatory documentation for home oxygen)  Patient Saturations on Room Air at Rest = 96 %  Patient Saturations on Room Air while Ambulating = 88 %  Patient Saturations on  2L  Liters of oxygen while Ambulating = 95%  Please briefly explain why patient needs home oxygen: [place]

## 2020-11-15 DIAGNOSIS — I10 Essential (primary) hypertension: Secondary | ICD-10-CM

## 2020-11-15 LAB — CBC WITH DIFFERENTIAL/PLATELET
Abs Immature Granulocytes: 0.25 10*3/uL — ABNORMAL HIGH (ref 0.00–0.07)
Basophils Absolute: 0 10*3/uL (ref 0.0–0.1)
Basophils Relative: 0 %
Eosinophils Absolute: 0 10*3/uL (ref 0.0–0.5)
Eosinophils Relative: 0 %
HCT: 36.1 % (ref 36.0–46.0)
Hemoglobin: 11.5 g/dL — ABNORMAL LOW (ref 12.0–15.0)
Immature Granulocytes: 2 %
Lymphocytes Relative: 20 %
Lymphs Abs: 3.1 10*3/uL (ref 0.7–4.0)
MCH: 24.6 pg — ABNORMAL LOW (ref 26.0–34.0)
MCHC: 31.9 g/dL (ref 30.0–36.0)
MCV: 77.3 fL — ABNORMAL LOW (ref 80.0–100.0)
Monocytes Absolute: 1.2 10*3/uL — ABNORMAL HIGH (ref 0.1–1.0)
Monocytes Relative: 8 %
Neutro Abs: 10.9 10*3/uL — ABNORMAL HIGH (ref 1.7–7.7)
Neutrophils Relative %: 70 %
Platelets: 511 10*3/uL — ABNORMAL HIGH (ref 150–400)
RBC: 4.67 MIL/uL (ref 3.87–5.11)
RDW: 16.7 % — ABNORMAL HIGH (ref 11.5–15.5)
WBC: 15.5 10*3/uL — ABNORMAL HIGH (ref 4.0–10.5)
nRBC: 0 % (ref 0.0–0.2)

## 2020-11-15 LAB — COMPREHENSIVE METABOLIC PANEL WITH GFR
ALT: 27 U/L (ref 0–44)
AST: 27 U/L (ref 15–41)
Albumin: 3.5 g/dL (ref 3.5–5.0)
Alkaline Phosphatase: 60 U/L (ref 38–126)
Anion gap: 14 (ref 5–15)
BUN: 13 mg/dL (ref 6–20)
CO2: 26 mmol/L (ref 22–32)
Calcium: 8.8 mg/dL — ABNORMAL LOW (ref 8.9–10.3)
Chloride: 97 mmol/L — ABNORMAL LOW (ref 98–111)
Creatinine, Ser: 0.75 mg/dL (ref 0.44–1.00)
GFR, Estimated: >60 mL/min
Glucose, Bld: 95 mg/dL (ref 70–99)
Potassium: 3.1 mmol/L — ABNORMAL LOW (ref 3.5–5.1)
Sodium: 137 mmol/L (ref 135–145)
Total Bilirubin: 0.7 mg/dL (ref 0.3–1.2)
Total Protein: 7 g/dL (ref 6.5–8.1)

## 2020-11-15 LAB — C-REACTIVE PROTEIN: CRP: 2.8 mg/dL — ABNORMAL HIGH

## 2020-11-15 LAB — CULTURE, BLOOD (ROUTINE X 2)
Culture: NO GROWTH
Culture: NO GROWTH
Special Requests: ADEQUATE
Special Requests: ADEQUATE

## 2020-11-15 LAB — D-DIMER, QUANTITATIVE: D-Dimer, Quant: 1.16 ug{FEU}/mL — ABNORMAL HIGH (ref 0.00–0.50)

## 2020-11-15 LAB — PHOSPHORUS: Phosphorus: 3.2 mg/dL (ref 2.5–4.6)

## 2020-11-15 LAB — FERRITIN: Ferritin: 50 ng/mL (ref 11–307)

## 2020-11-15 LAB — MAGNESIUM: Magnesium: 2.1 mg/dL (ref 1.7–2.4)

## 2020-11-15 LAB — TROPONIN I (HIGH SENSITIVITY)
Troponin I (High Sensitivity): 8 ng/L (ref <18)
Troponin I (High Sensitivity): 9 ng/L

## 2020-11-15 LAB — LACTATE DEHYDROGENASE: LDH: 291 U/L — ABNORMAL HIGH (ref 98–192)

## 2020-11-15 MED ORDER — ALPRAZOLAM 0.25 MG PO TABS
0.2500 mg | ORAL_TABLET | Freq: Once | ORAL | Status: AC
  Administered 2020-11-15: 0.25 mg via ORAL
  Filled 2020-11-15: qty 1

## 2020-11-15 MED ORDER — HYDROCOD POLST-CPM POLST ER 10-8 MG/5ML PO SUER
5.0000 mL | Freq: Two times a day (BID) | ORAL | Status: DC
  Administered 2020-11-15 – 2020-11-18 (×7): 5 mL via ORAL
  Filled 2020-11-15 (×7): qty 5

## 2020-11-15 MED ORDER — ALUM & MAG HYDROXIDE-SIMETH 200-200-20 MG/5ML PO SUSP
30.0000 mL | Freq: Once | ORAL | Status: AC
  Filled 2020-11-15: qty 30

## 2020-11-15 NOTE — Progress Notes (Signed)
PROGRESS NOTE    April Lewis  OZH:[id number] DOB: August 26, 1981 DOA: 11/10/2020 PCP: Gillian Scarce, MD    Brief Narrative:  April Lewis was admitted to the hospital working diagnosis of acute hypoxic respiratory failure due to SARS COVID-19 viral pneumonia.  40 year old female past medical history for asthma, who developed dyspnea and cough.  5 days of persistent and worsening symptoms, persistent despite over-the-counter medications.  She went to urgent care where she was diagnosed with COVID-19, where she was advised to come to the hospital if worsening symptoms.  On her initial physical examination blood pressure 141/82, heart rate 126, respiratory rate 23, oxygen saturation 95% on supplemental oxygen.  Her lungs had no wheezing or rhonchi, heart S1-S2, present, tachycardic, soft abdomen, no lower extremity edema.  Sodium 134, potassium 3.5, chloride 97, bicarb 26, glucose 122, BUN 5, creatinine 0.9, white count 8.7, hemoglobin 12.8, hematocrit 40.7, platelets 300.  Chest radiograph with bilateral interstitial infiltrates, lower lobes and right upper lobe. CT chest negative for pulmonary embolism.  Bilateral groundglass opacities predominantly upper lobes and left lower lobe  EKG 122 bpm, normal axis, normal intervals, sinus rhythm, no ST segment or T wave changes.  Assessment & Plan:   Active Problems:   COVID-19   Morbidly obese (HCC)   1. Acute hypoxemic respiratory failure due to SARS COVID 19 viral pneumonia.   RR: 23  Pulse oxymetry: 93 to 94% Fi02: 2 L/min per [place]   COVID-19 Labs  Recent Labs    11/13/20 0424 11/14/20 0443 11/15/20 0113  DDIMER 1.12* 0.98* 1.16*  FERRITIN 75 60 50  LDH 281* 279* 291*  CRP 2.0* 1.2* 2.8*    No results found for: SARSCOV2NAA  Patient having fevers today and not feeling well. Inflammatory markers continue to be elevated.  She completed redesivir infusion. Continue systemic corticosteroids with prednisone 50 mg  daily, bronchodilator theapy and antitussive agents. Airway clearing techniques and out of bed to chair tid with meal. Patient will need home 02 at discharge along with oral prednisone to complete 10 days.   2. HTN. Continue blood pressure control with losartan and HCTZ.   3. Asthma. No clinical signs of exacerbation, continue with bronchodilator therapy and montelukast.   4. Obesity class 3. BMI calculated is 43.15. high risk for inpatient complications.    Status is: Inpatient  Remains inpatient appropriate because:Inpatient level of care appropriate due to severity of illness   Dispo: The patient is from: Home              Anticipated d/c is to: Home              Anticipated d/c date is: 1 day              Patient currently is not medically stable to d/c.   Difficult to place patient No   DVT prophylaxis: Enoxaparin   Code Status:   full  Family Communication:  No family at the bedside       Subjective: Patient is feeling better, but not yet back to baseline, today had fever and generalized malaise. Positive anxiety and needed alprazolam this am.   Objective: Vitals:   11/14/20 2119 11/15/20 0016 11/15/20 0548 11/15/20 1220  BP: 124/77 (!) 114/55 (!) 128/57 127/73  Pulse: 91 77 89 (!) 103  Resp: 20 19 (!) 22 20  Temp: 98.6 F (37 C)  97.6 F (36.4 C) (!) 100.4 F (38 C)  TempSrc: Oral     SpO2:  94% 94% 93% 94%  Weight:      Height:        Intake/Output Summary (Last 24 hours) at 11/15/2020 1343 Last data filed at 11/14/2020 2300 Gross per 24 hour  Intake 240 ml  Output -  Net 240 ml   Filed Weights   11/10/20 1028 11/10/20 2153  Weight: 113.4 kg 110.5 kg    Examination:   General: deconditioned and ill looking appearing  Neurology: Awake and alert, non focal  E ENT: mild pallor, no icterus, oral mucosa moist Cardiovascular: No JVD. S1-S2 present, rhythmic, no gallops, rubs, or murmurs. No lower extremity edema. Pulmonary: positive breath sounds  bilaterally, with no wheezing, rhonchi or rales. Gastrointestinal. Abdomen soft and non tender Skin. No rashes Musculoskeletal: no joint deformities     Data Reviewed: I have personally reviewed following labs and imaging studies  CBC: Recent Labs  Lab 11/11/20 0331 11/12/20 0421 11/13/20 0424 11/14/20 0443 11/15/20 0113  WBC 4.0 8.9 11.6* 13.2* 15.5*  NEUTROABS 3.1 6.8 9.2* 9.7* 10.9*  HGB 11.5* 11.8* 11.2* 11.5* 11.5*  HCT 37.4 38.2 35.0* 36.8 36.1  MCV 77.6* 78.3* 76.4* 77.0* 77.3*  PLT 314 407* 437* 507* 511*   Basic Metabolic Panel: Recent Labs  Lab 11/11/20 0331 11/12/20 0421 11/13/20 0424 11/14/20 0443 11/15/20 0113  NA 137 138 138 137 137  K 4.1 3.7 3.8 3.8 3.1*  CL 101 100 99 99 97*  CO2 29 27 28 25 26   GLUCOSE 174* 147* 162* 143* 95  BUN 8 12 13 12 13   CREATININE 0.71 0.77 0.69 0.82 0.75  CALCIUM 8.8* 9.1 8.7* 9.1 8.8*  MG 2.1 2.1 2.2 2.5* 2.1  PHOS  --  4.2 4.5 3.9 3.2   GFR: Estimated Creatinine Clearance: 112.7 mL/min (by C-G formula based on SCr of 0.75 mg/dL). Liver Function Tests: Recent Labs  Lab 11/11/20 0331 11/12/20 0421 11/13/20 0424 11/14/20 0443 11/15/20 0113  AST 31 37 30 30 27   ALT 23 25 25 29 27   ALKPHOS 55 62 61 62 60  BILITOT 0.4 0.3 0.4 0.4 0.7  PROT 7.8 7.7 7.3 7.3 7.0  ALBUMIN 3.6 3.7 3.5 3.6 3.5   No results for input(s): LIPASE, AMYLASE in the last 168 hours. No results for input(s): AMMONIA in the last 168 hours. Coagulation Profile: No results for input(s): INR, PROTIME in the last 168 hours. Cardiac Enzymes: No results for input(s): CKTOTAL, CKMB, CKMBINDEX, TROPONINI in the last 168 hours. BNP (last 3 results) No results for input(s): PROBNP in the last 8760 hours. HbA1C: No results for input(s): HGBA1C in the last 72 hours. CBG: No results for input(s): GLUCAP in the last 168 hours. Lipid Profile: No results for input(s): CHOL, HDL, LDLCALC, TRIG, CHOLHDL, LDLDIRECT in the last 72 hours. Thyroid Function  Tests: No results for input(s): TSH, T4TOTAL, FREET4, T3FREE, THYROIDAB in the last 72 hours. Anemia Panel: Recent Labs    11/14/20 0443 11/15/20 0113  FERRITIN 60 50      Radiology Studies: I have reviewed all of the imaging during this hospital visit personally     Scheduled Meds: . vitamin C  500 mg Oral Daily  . enoxaparin (LOVENOX) injection  60 mg Subcutaneous Q24H  . losartan  50 mg Oral Daily   And  . hydrochlorothiazide  12.5 mg Oral Daily  . Ipratropium-Albuterol  1 puff Inhalation Q6H  . loratadine  10 mg Oral Daily  . montelukast  10 mg Oral QHS  . predniSONE  50 mg Oral Daily  . zinc sulfate  220 mg Oral Daily   Continuous Infusions:   LOS: 5 days        Janani Chamber Annett Gula, MD

## 2020-11-16 ENCOUNTER — Inpatient Hospital Stay (HOSPITAL_COMMUNITY): Payer: 59

## 2020-11-16 DIAGNOSIS — R609 Edema, unspecified: Secondary | ICD-10-CM | POA: Diagnosis not present

## 2020-11-16 DIAGNOSIS — J45909 Unspecified asthma, uncomplicated: Secondary | ICD-10-CM

## 2020-11-16 DIAGNOSIS — F32A Depression, unspecified: Secondary | ICD-10-CM

## 2020-11-16 DIAGNOSIS — U071 COVID-19: Principal | ICD-10-CM

## 2020-11-16 DIAGNOSIS — R7989 Other specified abnormal findings of blood chemistry: Secondary | ICD-10-CM

## 2020-11-16 DIAGNOSIS — J9601 Acute respiratory failure with hypoxia: Secondary | ICD-10-CM

## 2020-11-16 DIAGNOSIS — E669 Obesity, unspecified: Secondary | ICD-10-CM

## 2020-11-16 DIAGNOSIS — J1282 Pneumonia due to coronavirus disease 2019: Secondary | ICD-10-CM

## 2020-11-16 LAB — D-DIMER, QUANTITATIVE: D-Dimer, Quant: 1.93 ug/mL-FEU — ABNORMAL HIGH (ref 0.00–0.50)

## 2020-11-16 LAB — GLUCOSE, CAPILLARY: Glucose-Capillary: 96 mg/dL (ref 70–99)

## 2020-11-16 LAB — COMPREHENSIVE METABOLIC PANEL
ALT: 25 U/L (ref 0–44)
AST: 21 U/L (ref 15–41)
Albumin: 3.3 g/dL — ABNORMAL LOW (ref 3.5–5.0)
Alkaline Phosphatase: 58 U/L (ref 38–126)
Anion gap: 10 (ref 5–15)
BUN: 9 mg/dL (ref 6–20)
CO2: 28 mmol/L (ref 22–32)
Calcium: 8.7 mg/dL — ABNORMAL LOW (ref 8.9–10.3)
Chloride: 98 mmol/L (ref 98–111)
Creatinine, Ser: 0.73 mg/dL (ref 0.44–1.00)
GFR, Estimated: >60 mL/min (ref >60)
Glucose, Bld: 111 mg/dL — ABNORMAL HIGH (ref 70–99)
Potassium: 3.3 mmol/L — ABNORMAL LOW (ref 3.5–5.1)
Sodium: 136 mmol/L (ref 135–145)
Total Bilirubin: 0.5 mg/dL (ref 0.3–1.2)
Total Protein: 6.9 g/dL (ref 6.5–8.1)

## 2020-11-16 LAB — FERRITIN: Ferritin: 61 ng/mL (ref 11–307)

## 2020-11-16 LAB — C-REACTIVE PROTEIN: CRP: 12.7 mg/dL — ABNORMAL HIGH (ref <1.0)

## 2020-11-16 MED ORDER — IPRATROPIUM-ALBUTEROL 20-100 MCG/ACT IN AERS
1.0000 | INHALATION_SPRAY | Freq: Four times a day (QID) | RESPIRATORY_TRACT | Status: DC | PRN
  Administered 2020-11-17: 1 via RESPIRATORY_TRACT

## 2020-11-16 MED ORDER — SALINE SPRAY 0.65 % NA SOLN
1.0000 | NASAL | Status: DC | PRN
  Administered 2020-11-16 – 2020-11-17 (×2): 1 via NASAL
  Filled 2020-11-16: qty 44

## 2020-11-16 MED ORDER — POTASSIUM CHLORIDE CRYS ER 20 MEQ PO TBCR
40.0000 meq | EXTENDED_RELEASE_TABLET | ORAL | Status: AC
  Administered 2020-11-16 (×2): 40 meq via ORAL
  Filled 2020-11-16 (×2): qty 2

## 2020-11-16 MED ORDER — WITCH HAZEL-GLYCERIN EX PADS
MEDICATED_PAD | CUTANEOUS | Status: DC | PRN
  Administered 2020-11-17: 1 via TOPICAL
  Filled 2020-11-16: qty 100

## 2020-11-16 NOTE — Progress Notes (Signed)
Bilateral lower extremity venous duplex has been completed. Preliminary results can be found in CV Proc through chart review.   11/16/20 11:22 AM Olen Cordial RVT

## 2020-11-16 NOTE — Progress Notes (Signed)
PROGRESS NOTE    April Lewis  WOE:[id number] DOB: 1981-04-02 DOA: 11/10/2020 PCP: Gillian Scarce, MD    Brief Narrative:  Brief/Interim Summary: [name] was admitted to the hospital working diagnosis of acute hypoxic respiratory failure due to SARS COVID-19 viral pneumonia.  40 year old female past medical history for asthma, who developed dyspnea and cough. 5 days of persistent and worsening symptoms, persistent despite over-the-counter medications. She went to urgent care where she was diagnosed with COVID-19, and she was advised to come to the hospital if worsening symptoms. On her initial physical examination blood pressure 141/82, heart rate 126, respiratory rate23, oxygen saturation 95% on supplemental oxygen. Her lungs had no wheezing or rhonchi, heart S1-S2, present, tachycardic, soft abdomen, no lower extremity edema.  Sodium 134, potassium 3.5, chloride 97, bicarb 26, glucose 122, BUN 5, creatinine 0.9, white count 8.7, hemoglobin 12.8, hematocrit 40.7, platelets 300.  Chest radiograph with bilateral interstitial infiltrates, lower lobes and right upper lobe. CT chest negative for pulmonary embolism. Bilateral groundglass opacities predominantly upper lobes and left lower lobe  EKG 122 bpm, normal axis, normal intervals, sinus rhythm, no ST segment or T wave changes.   Assessment & Plan:   Principal Problem:   Pneumonia due to COVID-19 virus Active Problems:   Depression   Asthma   Acute respiratory failure with hypoxia (HCC)   Class 3 obesity   1. Acute hypoxemic respiratory failure due to SARS COVID 19 viral pneumonia. Sp remdesivir #5/5  RR: 20  Pulse oxymetry: 95%  Fi02: 2 L/min per [place]   COVID-19 Labs  Recent Labs    11/14/20 0443 11/15/20 0113 11/16/20 0426  DDIMER 0.98* 1.16* 1.93*  FERRITIN 60 50 61  LDH 279* 291*  --   CRP 1.2* 2.8* 12.7*    No results found for: SARSCOV2NAA  Patient with worsening inflammatory  markers, continue to have low energy and dyspnea. Positive oxygen desaturation on ambulation.  Wbc is 15,5. (reactive leukocytosis).   Medical therapy with prednisone 50 mg daily, bronchodilator therapy,  and antitussive agents. Continue with airway clearing techniques and out of bed to chair tid with meal.  Check [place] lower extremities to rule out DVT.   Follow up on inflammatory markers in am, if improving, will plan to discharge home with oral prednisone to complete total of 10 days.   2. HTN. On losartan and HCTZ for blood pressure control.    3. Asthma. No acute clinical signs of exacerbation. On bronchodilator therapy and montelukast.   4. Obesity class 3. BMI calculated is 43.15. Continue to be at high risk for inpatient complications.   5. Hypokalemia. K is 3,3 and renal function with serum cr at 0,73 and bicarbonate at 28. Add 80 meq Kcl in 2 divided doses and follow up renal function in am.   Status is: Inpatient  Remains inpatient appropriate because:Inpatient level of care appropriate due to severity of illness   Dispo: The patient is from: Home              Anticipated d/c is to: Home              Anticipated d/c date is: 1 day              Patient currently is not medically stable to d/c. Plan for possible dc in am, if improvement in inflammatory markers.    Difficult to place patient No    DVT prophylaxis: Enoxaparin   Code Status:   full  Family Communication:  No family at the bedside, she has been communicating with her family.      Subjective: Patient continue not feeling well, very tired and positive dyspnea on exertion, positive legs cramps, no nausea or vomiting,   Objective: Vitals:   11/15/20 2137 11/16/20 0518 11/16/20 0800 11/16/20 1223  BP: 106/68 123/63  105/64  Pulse: 79 85  82  Resp: 20 18  20   Temp: 98.3 F (36.8 C) 97.6 F (36.4 C) 99.9 F (37.7 C) 97.6 F (36.4 C)  TempSrc: Oral Oral Oral Oral  SpO2: 91% 95%  95%  Weight:       Height:        Intake/Output Summary (Last 24 hours) at 11/16/2020 1320 Last data filed at 11/15/2020 1500 Gross per 24 hour  Intake -  Output 200 ml  Net -200 ml   Filed Weights   11/10/20 1028 11/10/20 2153  Weight: 113.4 kg 110.5 kg    Examination:   General: Not in pain or dyspnea, deconditioned  Neurology: Awake and alert, non focal  E ENT: mild pallor, no icterus, oral mucosa moist Cardiovascular: No JVD. S1-S2 present, rhythmic, no gallops, rubs, or murmurs. No lower extremity edema. Pulmonary: positive  breath sounds bilaterally,  Poor air movement, Gastrointestinal. Abdomen soft and non tender Skin. No rashes Musculoskeletal: no joint deformities     Data Reviewed: I have personally reviewed following labs and imaging studies  CBC: Recent Labs  Lab 11/11/20 0331 11/12/20 0421 11/13/20 0424 11/14/20 0443 11/15/20 0113  WBC 4.0 8.9 11.6* 13.2* 15.5*  NEUTROABS 3.1 6.8 9.2* 9.7* 10.9*  HGB 11.5* 11.8* 11.2* 11.5* 11.5*  HCT 37.4 38.2 35.0* 36.8 36.1  MCV 77.6* 78.3* 76.4* 77.0* 77.3*  PLT 314 407* 437* 507* 511*   Basic Metabolic Panel: Recent Labs  Lab 11/11/20 0331 11/12/20 0421 11/13/20 0424 11/14/20 0443 11/15/20 0113 11/16/20 0426  NA 137 138 138 137 137 136  K 4.1 3.7 3.8 3.8 3.1* 3.3*  CL 101 100 99 99 97* 98  CO2 29 27 28 25 26 28   GLUCOSE 174* 147* 162* 143* 95 111*  BUN 8 12 13 12 13 9   CREATININE 0.71 0.77 0.69 0.82 0.75 0.73  CALCIUM 8.8* 9.1 8.7* 9.1 8.8* 8.7*  MG 2.1 2.1 2.2 2.5* 2.1  --   PHOS  --  4.2 4.5 3.9 3.2  --    GFR: Estimated Creatinine Clearance: 112.7 mL/min (by C-G formula based on SCr of 0.73 mg/dL). Liver Function Tests: Recent Labs  Lab 11/12/20 0421 11/13/20 0424 11/14/20 0443 11/15/20 0113 11/16/20 0426  AST 37 30 30 27 21   ALT 25 25 29 27 25   ALKPHOS 62 61 62 60 58  BILITOT 0.3 0.4 0.4 0.7 0.5  PROT 7.7 7.3 7.3 7.0 6.9  ALBUMIN 3.7 3.5 3.6 3.5 3.3*   No results for input(s): LIPASE, AMYLASE in  the last 168 hours. No results for input(s): AMMONIA in the last 168 hours. Coagulation Profile: No results for input(s): INR, PROTIME in the last 168 hours. Cardiac Enzymes: No results for input(s): CKTOTAL, CKMB, CKMBINDEX, TROPONINI in the last 168 hours. BNP (last 3 results) No results for input(s): PROBNP in the last 8760 hours. HbA1C: No results for input(s): HGBA1C in the last 72 hours. CBG: No results for input(s): GLUCAP in the last 168 hours. Lipid Profile: No results for input(s): CHOL, HDL, LDLCALC, TRIG, CHOLHDL, LDLDIRECT in the last 72 hours. Thyroid Function Tests: No results for input(s): TSH,  T4TOTAL, FREET4, T3FREE, THYROIDAB in the last 72 hours. Anemia Panel: Recent Labs    11/15/20 0113 11/16/20 0426  FERRITIN 50 61      Radiology Studies: I have reviewed all of the imaging during this hospital visit personally     Scheduled Meds: . vitamin C  500 mg Oral Daily  . chlorpheniramine-HYDROcodone  5 mL Oral Q12H  . losartan  50 mg Oral Daily   And  . hydrochlorothiazide  12.5 mg Oral Daily  . Ipratropium-Albuterol  1 puff Inhalation Q6H  . loratadine  10 mg Oral Daily  . montelukast  10 mg Oral QHS  . potassium chloride  40 mEq Oral Q4H  . predniSONE  50 mg Oral Daily  . zinc sulfate  220 mg Oral Daily   Continuous Infusions:   LOS: 6 days        Solina Heron Annett Gula, MD

## 2020-11-17 ENCOUNTER — Inpatient Hospital Stay (HOSPITAL_COMMUNITY): Payer: 59

## 2020-11-17 LAB — BASIC METABOLIC PANEL
Anion gap: 9 (ref 5–15)
BUN: 8 mg/dL (ref 6–20)
CO2: 27 mmol/L (ref 22–32)
Calcium: 8.5 mg/dL — ABNORMAL LOW (ref 8.9–10.3)
Chloride: 101 mmol/L (ref 98–111)
Creatinine, Ser: 0.69 mg/dL (ref 0.44–1.00)
GFR, Estimated: >60 mL/min (ref >60)
Glucose, Bld: 83 mg/dL (ref 70–99)
Potassium: 4.4 mmol/L (ref 3.5–5.1)
Sodium: 137 mmol/L (ref 135–145)

## 2020-11-17 LAB — FERRITIN: Ferritin: 57 ng/mL (ref 11–307)

## 2020-11-17 LAB — C-REACTIVE PROTEIN: CRP: 7.8 mg/dL — ABNORMAL HIGH (ref <1.0)

## 2020-11-17 LAB — D-DIMER, QUANTITATIVE: D-Dimer, Quant: 3.89 ug/mL-FEU — ABNORMAL HIGH (ref 0.00–0.50)

## 2020-11-17 MED ORDER — ENOXAPARIN SODIUM 60 MG/0.6ML ~~LOC~~ SOLN
0.5000 mg/kg | Freq: Every day | SUBCUTANEOUS | Status: DC
  Administered 2020-11-17 – 2020-11-18 (×2): 55 mg via SUBCUTANEOUS
  Filled 2020-11-17 (×2): qty 0.6

## 2020-11-17 MED ORDER — ENOXAPARIN SODIUM 40 MG/0.4ML ~~LOC~~ SOLN: 40.0000 mg | SUBCUTANEOUS | Status: DC

## 2020-11-17 MED ORDER — ALBUTEROL SULFATE HFA 108 (90 BASE) MCG/ACT IN AERS: 1.0000 | INHALATION_SPRAY | RESPIRATORY_TRACT | Status: DC | PRN

## 2020-11-17 MED ORDER — FUROSEMIDE 10 MG/ML IJ SOLN
40.0000 mg | Freq: Once | INTRAMUSCULAR | Status: AC
  Administered 2020-11-17: 40 mg via INTRAVENOUS
  Filled 2020-11-17: qty 4

## 2020-11-17 MED ORDER — FLUTICASONE PROPIONATE 50 MCG/ACT NA SUSP
1.0000 | Freq: Two times a day (BID) | NASAL | Status: DC
  Administered 2020-11-17 – 2020-11-18 (×3): 1 via NASAL
  Filled 2020-11-17: qty 16

## 2020-11-17 MED ORDER — IPRATROPIUM-ALBUTEROL 20-100 MCG/ACT IN AERS
1.0000 | INHALATION_SPRAY | Freq: Four times a day (QID) | RESPIRATORY_TRACT | Status: DC
  Administered 2020-11-17 – 2020-11-18 (×5): 1 via RESPIRATORY_TRACT
  Filled 2020-11-17: qty 4

## 2020-11-17 NOTE — Progress Notes (Signed)
Report called to [place] RN [facility]. Kym Scannell, Yancey Flemings, RN

## 2020-11-17 NOTE — Progress Notes (Signed)
SATURATION QUALIFICATIONS: (This note is used to comply with regulatory documentation for home oxygen)  Patient Saturations on Room Air at Rest = 88%  Patient Saturations on Room Air while Ambulating =82%  Patient Saturations on 3  Liters of oxygen while Ambulating = 88-90%  Please briefly explain why patient needs home oxygen: Pt requires oxygen at [facility] at rest to maintain sat of 90-92%.   Patient's  sat levels decrease to 88-90% on 3L with activity.  Myrikal Messmer, Yancey Flemings, RN

## 2020-11-17 NOTE — TOC Initial Note (Signed)
Transition of Care [facility]) - Initial/Assessment Note    Patient Details  Name: April Lewis MRN: [id number] Date of Birth: 1980-12-23  Transition of Care [facility]) CM/SW Contact:    Darleene Cleaver, LCSW Phone Number: 11/17/2020, 4:26 PM  Clinical Narrative:                  Patient is a 40 year old female who is alert and oriented x4. Patient is Covid+ and will need oxygen upon discharge.  CSW contacted [employer], and spoke to [place], he stated he can provide oxygen for patient.  CSW to continue to follow patient's progress throughout discharge planning.  Expected Discharge Plan: Home/Self Care Barriers to Discharge: Continued Medical Work up   Patient Goals and CMS Choice Patient states their goals for this hospitalization and ongoing recovery are:: To return back home with oxygen CMS Medicare.gov Compare Post Acute Care list provided to:: Patient Choice offered to / list presented to : Patient  Expected Discharge Plan and Services Expected Discharge Plan: Home/Self Care       Living arrangements for the past 2 months: Single Family Home                   DME Agency: Other - Comment Loyal Buba) Date DME Agency Contacted: 11/17/20 Time DME Agency Contacted: [phone] Representative spoke with at DME Agency: Vaughan Basta            Prior Living Arrangements/Services Living arrangements for the past 2 months: Single Family Home Lives with:: Self Patient language and need for interpreter reviewed:: Yes Do you feel safe going back to the place where you live?: Yes      Need for Family Participation in Patient Care: No (Comment) Care giver support system in place?: No (comment)   Criminal Activity/Legal Involvement Pertinent to Current Situation/Hospitalization: No - Comment as needed  Activities of Daily Living Home Assistive Devices/Equipment: None ADL Screening (condition at time of admission) Patient's cognitive ability adequate to safely complete daily activities?:  Yes Is the patient deaf or have difficulty hearing?: No Does the patient have difficulty seeing, even when wearing glasses/contacts?: No Does the patient have difficulty concentrating, remembering, or making decisions?: No Patient able to express need for assistance with ADLs?: Yes Does the patient have difficulty dressing or bathing?: Yes (secondary to increasing shortness of breath) Independently performs ADLs?: No (secondary to increased shortness of breath) Communication: Independent Dressing (OT): Needs assistance Is this a change from baseline?: Change from baseline, expected to last >3 days Grooming: Independent Feeding: Independent Bathing: Needs assistance Is this a change from baseline?: Change from baseline, expected to last >3 days Toileting: Needs assistance Is this a change from baseline?: Change from baseline, expected to last >3days In/Out Bed: Needs assistance Is this a change from baseline?: Change from baseline, expected to last >3 days Walks in Home: Needs assistance Is this a change from baseline?: Change from baseline, expected to last >3 days Does the patient have difficulty walking or climbing stairs?: Yes (secondary to shortness of breath and weakness) Weakness of Legs: Both Weakness of Arms/Hands: None  Permission Sought/Granted Permission sought to share information with : Other (comment) (DME company) Permission granted to share information with : Yes, Release of Information Signed  Share Information with NAME: Vanessa Kick Sister   [phone]  [facility] Mother [phone]  Permission granted to share info w AGENCY: DME company        Emotional Assessment Appearance:: Appears stated age   Affect (typically observed):  Accepting,Appropriate,Calm Orientation: : Oriented to Self,Oriented to Place,Oriented to  Time,Oriented to Situation Alcohol / Substance Use: Not Applicable Psych Involvement: No (comment)  Admission diagnosis:  Acute respiratory  failure with hypoxia (HCC) [J96.01] Pneumonia due to COVID-19 virus [U07.1, J12.82] COVID-19 [U07.1] Patient Active Problem List   Diagnosis Date Noted  . Pneumonia due to COVID-19 virus 11/16/2020  . Asthma 11/16/2020  . Acute respiratory failure with hypoxia (HCC) 11/16/2020  . Class 3 obesity 11/16/2020  . Morbidly obese (HCC) 11/11/2020  . COVID-19 11/10/2020  . Depression 09/18/2013  . Other malaise and fatigue 09/18/2013  . Screening for malignant neoplasm of the cervix 09/18/2013  . Need for prophylactic vaccination and inoculation against influenza 09/18/2013  . Routine general medical examination at a health care facility 09/18/2013  . Allergic rhinitis 01/21/2013   PCP:  Gillian Scarce, MD Pharmacy:   [phone] - [place], [place] - [address] [place] [address] Phone: [phone] Fax: [phone]  [id number] - [place] - [address] [place] [address] Phone: [phone] Fax: [phone]  [id number] - [place] [facility] - [address] [place] [address] Phone: [phone] Fax: [phone]     Social Determinants of Health (SDOH) Interventions    Readmission Risk Interventions No flowsheet data found.

## 2020-11-17 NOTE — Progress Notes (Signed)
PT Note  Patient Details Name: April Lewis MRN: [id number] DOB: 06-Feb-1981      PT ordered and was able to assess patient around 1:30 this afternoon. Pt doing better in her room and ambulating and mobilizing independently. I took time to thoroughly educated about breathing exercises, and general exercises that I will push forward to her in electronic version so she can perform daily at home.   She was on 3 L O2 [place] during entire session. Her O2 sats fluxuated a bit with activity up on her feet dropping to 82% however returned to 88-90 once seated with breathing exercises and after 1-2 minutes. With UE exercises and breathing exercises she was able to bring it to 94% at its highest.   Full detailed write up to follow soon.   No HHPT needed, but may benefit from the Respiroatoy clinic or post COVID clinic.    Marella Bile 11/17/2020, 3:39 PM  Clois Dupes, PT, MPT Acute Rehabilitation Services Office: [phone] Pager: [phone] 11/17/2020

## 2020-11-17 NOTE — Progress Notes (Signed)
Patient arrived to room 1331. Set up in room. Ambulatory. Ambulated to restroom voided. Denies needs at this time.

## 2020-11-17 NOTE — Plan of Care (Signed)
  Problem: Education: Goal: Knowledge of General Education information will improve Description: Including pain rating scale, medication(s)/side effects and non-pharmacologic comfort measures Outcome: Adequate for Discharge   Problem: Health Behavior/Discharge Planning: Goal: Ability to manage health-related needs will improve Outcome: Adequate for Discharge   Problem: Clinical Measurements: Goal: Ability to maintain clinical measurements within normal limits will improve Outcome: Adequate for Discharge Goal: Will remain free from infection Outcome: Adequate for Discharge Goal: Diagnostic test results will improve Outcome: Adequate for Discharge Goal: Respiratory complications will improve Outcome: Adequate for Discharge Goal: Cardiovascular complication will be avoided Outcome: Adequate for Discharge   Problem: Activity: Goal: Risk for activity intolerance will decrease Outcome: Adequate for Discharge   Problem: Coping: Goal: Level of anxiety will decrease Outcome: Adequate for Discharge   

## 2020-11-17 NOTE — Evaluation (Addendum)
Physical Therapy Evaluation Patient Details Name: April Lewis MRN: [id number] DOB: 26-Jan-1981 Today's Date: 11/17/2020   History of Present Illness  Pt admit with acute respiratory failure due to COVID19.  Clinical Impression    Pt doing better in her room and ambulating and mobilizing independently. I took time to thoroughly educated about breathing exercises, and general exercises that I will push forward to her in electronic version so she can perform daily at home.   She was on 3 L O2 [place] during entire session. Her O2 sats fluxuated a bit with activity up on her feet dropping to 82% however returned to 88-90 once seated with breathing exercises and after 1-2 minutes. With UE exercises and breathing exercises she was able to bring it to 94% at its highest.   No HHPT needed, but may benefit from the Respiroatoy clinic or post COVID clinic.     Follow Up Recommendations No PT follow up    Equipment Recommendations  None recommended by PT    Recommendations for Other Services       Precautions / Restrictions Precautions Precaution Comments: airborne due to COVID 19      Mobility  Bed Mobility Overal bed mobility: Independent                  Transfers Overall transfer level: Independent Equipment used: None                Ambulation/Gait Ambulation/Gait assistance: Independent Gait Distance (Feet): 30 Feet Assistive device: None       General Gait Details: steady on her feet, excited for me to tell her she can walk tot eh bathroom wiht no help and haw to take her oxygen monitor off temporarily to do so. ( nurse aware)  Stairs            Wheelchair Mobility    Modified Rankin (Stroke Patients Only)       Balance                                             Pertinent Vitals/Pain Pain Assessment: No/denies pain    Home Living Family/patient expects to be discharged to:: Private residence Living Arrangements:  Other relatives Available Help at Discharge: Family Type of Home: Apartment Home Access: Level entry     Home Layout: One level Home Equipment: None      Prior Function Level of Independence: Independent               Hand Dominance        Extremity/Trunk Assessment        Lower Extremity Assessment Lower Extremity Assessment: Overall WFL for tasks assessed       Communication   Communication: No difficulties  Cognition Arousal/Alertness: Awake/alert Behavior During Therapy: WFL for tasks assessed/performed Overall Cognitive Status: Within Functional Limits for tasks assessed                                        General Comments      Exercises Other Exercises Other Exercises: educated with breathing exercises, explain oxygenation and how to porgress and push yourself , taking short breaks for recovery, and exercises . Worked on sit to stand with no UE support, supine SLRs, bridges, and seated knee  extensions and UE with green TB for upper back and chest expansion. Pt was thankful for these exercises and I will send eletronically via medbridge.     Added electronic Medbridge exercise program:  Access Code: [email or web address] URL: [email or web address] Date: 11/18/2020 Prepared by: [employer]  Exercises Seated Pursed Lip Breathing - 3 x daily - 7 x weekly - 2 sets - 5 reps Supine Active Straight Leg Raise - 1 x daily - 7 x weekly - 2 sets - 10 reps Supine Bridge - 1 x daily - 7 x weekly - 2 sets - 10 reps Seated Shoulder Horizontal Abduction with Resistance - 3 x daily - 7 x weekly - 2 sets - 10 reps Seated Diagonal Reach with Resistance - 3 x daily - 7 x weekly - 2 sets - 10 reps Seated Elbow Flexion with Self-Anchored Resistance - 3 x daily - 7 x weekly - 2 sets - 10 reps Seated Elbow Extension with Self-Anchored Resistance - 3 x daily - 7 x weekly - 2 sets - 10 reps Seated Long Arc Quad - 3 x daily - 7 x weekly - 2 sets - 10  reps Sit to Stand without Arm Support - 3 x daily - 7 x weekly - 1 sets - 10 reps Standing Hip Abduction with Counter Support - 1 x daily - 7 x weekly - 2 sets - 10 reps Standing Hip Extension with Counter Support - 1 x daily - 7 x weekly - 2 sets - 10 reps Heel rises with counter support - 1 x daily - 7 x weekly - 2 sets - 10 reps Heel Toe Raises with Counter Support - 1 x daily - 7 x weekly - 2 sets - 10 reps   Assessment/Plan    PT Assessment Patient needs continued PT services;Patent does not need any further PT services  PT Problem List Decreased activity tolerance       PT Treatment Interventions      PT Goals (Current goals can be found in the Care Plan section)  Acute Rehab PT Goals PT Goal Formulation: All assessment and education complete, DC therapy    Frequency     Barriers to discharge        Co-evaluation               AM-PAC PT "6 Clicks" Mobility  Outcome Measure Help needed turning from your back to your side while in a flat bed without using bedrails?: None Help needed moving from lying on your back to sitting on the side of a flat bed without using bedrails?: None Help needed moving to and from a bed to a chair (including a wheelchair)?: None Help needed standing up from a chair using your arms (e.g., wheelchair or bedside chair)?: None Help needed to walk in hospital room?: None Help needed climbing 3-5 steps with a railing? : None 6 Click Score: 24    End of Session   Activity Tolerance: Patient tolerated treatment well Patient left: in bed;with call bell/phone within reach Nurse Communication: Mobility status PT Visit Diagnosis: Other abnormalities of gait and mobility (R26.89)    Time: 0814-4818 PT Time Calculation (min) (ACUTE ONLY): 28 min   Charges:   PT Evaluation $PT Eval Low Complexity: 1 Low          Akhilesh Sassone, PT, MPT Acute Rehabilitation Services Office: [phone] Pager: [phone] 11/17/2020   Marella Bile 11/17/2020, 7:17 PM

## 2020-11-17 NOTE — Plan of Care (Signed)
  Problem: Education: Goal: Knowledge of General Education information will improve Description: Including pain rating scale, medication(s)/side effects and non-pharmacologic comfort measures Outcome: Completed/Met   Problem: Clinical Measurements: Goal: Respiratory complications will improve Outcome: Completed/Met   Problem: Activity: Goal: Risk for activity intolerance will decrease Outcome: Completed/Met   Problem: Coping: Goal: Level of anxiety will decrease Outcome: Completed/Met

## 2020-11-17 NOTE — Progress Notes (Signed)
PROGRESS NOTE    April Lewis  WUJ:[id number] DOB: [birth year] DOA: 11/10/2020 PCP: Gillian Scarce, MD    Brief Narrative:  April Lewis was admitted to the hospital with the orking diagnosis of acute hypoxic respiratory failure due to SARS COVID-19 viral pneumonia.  40 year old female past medical history for asthma, who developed dyspnea and cough. 5 days of persistent and worsening symptoms, persistent despite over-the-counter medications. She went to urgent care where she was diagnosed with COVID-19,andshe was advised to come to the hospital if worsening symptoms. On her initial physical examination blood pressure 141/82, heart rate 126, respiratory rate23, oxygen saturation 95% on supplemental oxygen. Her lungs had no wheezing or rhonchi, heart S1-S2, present, tachycardic, soft abdomen, no lower extremity edema.  Sodium 134, potassium 3.5, chloride 97, bicarb 26, glucose 122, BUN 5, creatinine 0.9, white count 8.7, hemoglobin 12.8, hematocrit 40.7, platelets 300.  Chest radiograph with bilateral interstitial infiltrates, lower lobes and right upper lobe. CT chest negative for pulmonary embolism. Bilateral groundglass opacities predominantly upper lobes and left lower lobe  EKG 122 bpm, normal axis, normal intervals, sinus rhythm, no ST segment or T wave changes.  Patient received medical therapy with steroids and remdesivir with good toleration.  Slow recovery, will need home 02 and oral prednisone at home.     Assessment & Plan:   Principal Problem:   Pneumonia due to COVID-19 virus Active Problems:   Depression   Asthma   Acute respiratory failure with hypoxia (HCC)   Class 3 obesity   1. Acute hypoxemic respiratory failure due to SARS COVID 19 viral pneumonia.  Patient was admitted to the medical ward, she received medical therapy with intravenous corticosteroids and remdesivir.  She was treated with antitussive agents, bronchodilators and airway  clearing techniques. She had a slow recovery, her dyspnea and inflammatory markers improved.  COVID-19 Labs  Recent Labs    11/15/20 0113 11/16/20 0426 11/17/20 0431  DDIMER 1.16* 1.93* 3.89*  FERRITIN 50 61 57  LDH 291*  --   --   CRP 2.8* 12.7* 7.8*    No results found for: SARSCOV2NAA   She will continue 5 more days of oral prednisone at home.  Home oxygen will be arranged. Her oximetry on ambulation on room air was 88%.  Ultrasonography of lower extremities was negative for deep vein thrombosis.  Today with increased oxygen requirements and persistent dyspnea. Check chest film, encourage incentive spirometer, out of bed to chair TID with meals, consult PT and OT.  Add flonase for nasal congestion and continue with saline nasal spry.   2.  Hypertension.  Continue blood pressure control with hydrochlorothiazide and losartan.  3.  Asthma.  No signs of acute exacerbation, continue montelukast and bronchodilators.  4.  Hypokalemia.  Patient received potassium chloride with correction of serum potassium.  Her kidney function remained stable. At discharge sodium 137, potassium 4.4, chloride 101, bicarb 27, glucose 83, BUN 8, creatinine 0.69.  5.  Obesity class III.  Her calculated BMI is 43.1, continue outpatient follow-up.  Status is: Inpatient  Remains inpatient appropriate because:Inpatient level of care appropriate due to severity of illness   Dispo: The patient is from: Home              Anticipated d/c is to: Home              Anticipated d/c date is: 1 day              Patient currently is not  medically stable to d/c.   Difficult to place patient No   DVT prophylaxis: Enoxaparin   Code Status:   full Family Communication:  Patient talking to her family with her cell phone     Subjective: Patient is feeling better but not yet back to baseline, continue to have leg cramps and dyspnea with exertion, no nausea or vomiting.   Objective: Vitals:   11/16/20  0800 11/16/20 1223 11/16/20 2123 11/17/20 0555  BP:  105/64 124/80 117/68  Pulse:  82 (!) 104 (!) 109  Resp:  20 16 18   Temp: 99.9 F (37.7 C) 97.6 F (36.4 C) 99.2 F (37.3 C) 98.1 F (36.7 C)  TempSrc: Oral Oral Oral Oral  SpO2:  95% 96% 92%  Weight:      Height:        Intake/Output Summary (Last 24 hours) at 11/17/2020 1047 Last data filed at 11/16/2020 1300 Gross per 24 hour  Intake 120 ml  Output --  Net 120 ml   Filed Weights   11/10/20 1028 11/10/20 2153  Weight: 113.4 kg 110.5 kg    Examination:   General: Not in pain or dyspnea. Deconditioned  Neurology: Awake and alert, non focal  E ENT: no pallor, no icterus, oral mucosa moist Cardiovascular: No JVD. S1-S2 present, rhythmic, no gallops, rubs, or murmurs. No lower extremity edema. Pulmonary: positive breath sounds bilaterally, decreased breath sounds at bases.  Gastrointestinal. Abdomen soft and non tender Skin. No rashes Musculoskeletal: no joint deformities     Data Reviewed: I have personally reviewed following labs and imaging studies  CBC: Recent Labs  Lab 11/11/20 0331 11/12/20 0421 11/13/20 0424 11/14/20 0443 11/15/20 0113  WBC 4.0 8.9 11.6* 13.2* 15.5*  NEUTROABS 3.1 6.8 9.2* 9.7* 10.9*  HGB 11.5* 11.8* 11.2* 11.5* 11.5*  HCT 37.4 38.2 35.0* 36.8 36.1  MCV 77.6* 78.3* 76.4* 77.0* 77.3*  PLT 314 407* 437* 507* 511*   Basic Metabolic Panel: Recent Labs  Lab 11/11/20 0331 11/12/20 0421 11/13/20 0424 11/14/20 0443 11/15/20 0113 11/16/20 0426 11/17/20 0431  NA 137 138 138 137 137 136 137  K 4.1 3.7 3.8 3.8 3.1* 3.3* 4.4  CL 101 100 99 99 97* 98 101  CO2 29 27 28 25 26 28 27   GLUCOSE 174* 147* 162* 143* 95 111* 83  BUN 8 12 13 12 13 9 8   CREATININE 0.71 0.77 0.69 0.82 0.75 0.73 0.69  CALCIUM 8.8* 9.1 8.7* 9.1 8.8* 8.7* 8.5*  MG 2.1 2.1 2.2 2.5* 2.1  --   --   PHOS  --  4.2 4.5 3.9 3.2  --   --    GFR: Estimated Creatinine Clearance: 112.7 mL/min (by C-G formula based on SCr of  0.69 mg/dL). Liver Function Tests: Recent Labs  Lab 11/12/20 0421 11/13/20 0424 11/14/20 0443 11/15/20 0113 11/16/20 0426  AST 37 30 30 27 21   ALT 25 25 29 27 25   ALKPHOS 62 61 62 60 58  BILITOT 0.3 0.4 0.4 0.7 0.5  PROT 7.7 7.3 7.3 7.0 6.9  ALBUMIN 3.7 3.5 3.6 3.5 3.3*   No results for input(s): LIPASE, AMYLASE in the last 168 hours. No results for input(s): AMMONIA in the last 168 hours. Coagulation Profile: No results for input(s): INR, PROTIME in the last 168 hours. Cardiac Enzymes: No results for input(s): CKTOTAL, CKMB, CKMBINDEX, TROPONINI in the last 168 hours. BNP (last 3 results) No results for input(s): PROBNP in the last 8760 hours. HbA1C: No results for input(s):  HGBA1C in the last 72 hours. CBG: Recent Labs  Lab 11/16/20 0749  GLUCAP 96   Lipid Profile: No results for input(s): CHOL, HDL, LDLCALC, TRIG, CHOLHDL, LDLDIRECT in the last 72 hours. Thyroid Function Tests: No results for input(s): TSH, T4TOTAL, FREET4, T3FREE, THYROIDAB in the last 72 hours. Anemia Panel: Recent Labs    11/16/20 0426 11/17/20 0431  FERRITIN 61 57      Radiology Studies: I have reviewed all of the imaging during this hospital visit personally     Scheduled Meds: . vitamin C  500 mg Oral Daily  . chlorpheniramine-HYDROcodone  5 mL Oral Q12H  . fluticasone  1 spray Each Nare BID  . losartan  50 mg Oral Daily   And  . hydrochlorothiazide  12.5 mg Oral Daily  . loratadine  10 mg Oral Daily  . montelukast  10 mg Oral QHS  . predniSONE  50 mg Oral Daily  . zinc sulfate  220 mg Oral Daily   Continuous Infusions:   LOS: 7 days        Adalin Vanderploeg Annett Gula, MD

## 2020-11-18 LAB — D-DIMER, QUANTITATIVE: D-Dimer, Quant: 3.85 ug/mL-FEU — ABNORMAL HIGH (ref 0.00–0.50)

## 2020-11-18 LAB — COMPREHENSIVE METABOLIC PANEL
ALT: 22 U/L (ref 0–44)
AST: 19 U/L (ref 15–41)
Albumin: 3.5 g/dL (ref 3.5–5.0)
Alkaline Phosphatase: 63 U/L (ref 38–126)
Anion gap: 12 (ref 5–15)
BUN: 9 mg/dL (ref 6–20)
CO2: 30 mmol/L (ref 22–32)
Calcium: 9.3 mg/dL (ref 8.9–10.3)
Chloride: 96 mmol/L — ABNORMAL LOW (ref 98–111)
Creatinine, Ser: 0.82 mg/dL (ref 0.44–1.00)
GFR, Estimated: >60 mL/min (ref >60)
Glucose, Bld: 112 mg/dL — ABNORMAL HIGH (ref 70–99)
Potassium: 3.7 mmol/L (ref 3.5–5.1)
Sodium: 138 mmol/L (ref 135–145)
Total Bilirubin: 0.5 mg/dL (ref 0.3–1.2)
Total Protein: 7.5 g/dL (ref 6.5–8.1)

## 2020-11-18 LAB — FERRITIN: Ferritin: 57 ng/mL (ref 11–307)

## 2020-11-18 LAB — C-REACTIVE PROTEIN: CRP: 6.9 mg/dL — ABNORMAL HIGH (ref <1.0)

## 2020-11-18 MED ORDER — GUAIFENESIN-DM 100-10 MG/5ML PO SYRP: 5.0000 mL | ORAL_SOLUTION | ORAL | Status: DC | PRN

## 2020-11-18 MED ORDER — PREDNISONE 20 MG PO TABS: 20.0000 mg | ORAL_TABLET | Freq: Every day | ORAL | 0 refills | Status: AC

## 2020-11-18 MED ORDER — ALBUTEROL SULFATE HFA 108 (90 BASE) MCG/ACT IN AERS: 1.0000 | INHALATION_SPRAY | RESPIRATORY_TRACT | 0 refills | Status: AC | PRN

## 2020-11-18 MED ORDER — FLUTICASONE PROPIONATE 50 MCG/ACT NA SUSP: 1.0000 | Freq: Two times a day (BID) | NASAL | 0 refills | Status: AC

## 2020-11-18 MED ORDER — GUAIFENESIN-DM 100-10 MG/5ML PO SYRP: 5.0000 mL | ORAL_SOLUTION | Freq: Four times a day (QID) | ORAL | 0 refills | Status: AC | PRN

## 2020-11-18 NOTE — Discharge Summary (Signed)
Physician Discharge Summary  April AfricaSharonda N Lewis ZOX:[id number] DOB: [birth year] DOA: 11/10/2020  PCP: Gillian ScarceZanard, Robyn K, MD  Admit date: 11/10/2020 Discharge date: 11/18/2020  Admitted From: Home  Disposition:  Home   Recommendations for Outpatient Follow-up and new medication changes:  1. Follow up with Dr. Alberteen SamZanard in 2 weeks 2. Continue prednisone for the next 5 days. 3. Self quarantine for the next 7 days, use a mask in public and maintain physical distancing.   Home Health: no   Equipment/Devices: home 02    Discharge Condition: stable  CODE STATUS: full  Diet recommendation: heart healthy   Brief/Interim Summary: April Lewis was admitted to the hospitalwith the working diagnosis of acute hypoxic respiratory failure due to SARS COVID-19 viral pneumonia.  40 year old female with the past medical history for asthma, who developed dyspnea and cough. 5 days of persistent and worsening symptoms, persistent despite over-the-counter medications. She went to urgent care where she was diagnosed with COVID-19,andshe was advised to come to the hospital if worsening symptoms. On her initial physical examination blood pressure 141/82, heart rate 126, respiratory rate23, oxygen saturation 95% on supplemental oxygen. Her lungs had no wheezing or rhonchi, heart S1-S2, present, tachycardic, soft abdomen, no lower extremity edema.  Sodium 134, potassium 3.5, chloride 97, bicarb 26, glucose 122, BUN 5, creatinine 0.9, white count 8.7, hemoglobin 12.8, hematocrit 40.7, platelets 300.  Chest radiograph with bilateral interstitial infiltrates, lower lobes and right upper lobe. CT chest negative for pulmonary embolism. Bilateral groundglass opacities predominantly upper lobes and left lower lobe  EKG 122 bpm, normal axis, normal intervals, sinus rhythm, no ST segment or T wave changes.  Patient received medical therapy with steroids and remdesivir with good toleration.   Developed acute non  cardiogenic pulmonary edema, improved with furosemide.   Slow recovery, will need home 02 and oral prednisone at home.   1. Acute hypoxemic respiratory failure due to SARS COVID 19 viral pneumonia.  Patient was admitted to the medical ward, she received medical therapy with intravenous corticosteroids and remdesivir. She was treated with antitussive agents, bronchodilators and airway clearing techniques. She had a slow recovery, her dyspnea and inflammatory markers improved  COVID-19 Labs  Recent Labs    11/16/20 0426 11/17/20 0431 11/18/20 0257  DDIMER 1.93* 3.89* 3.85*  FERRITIN 61 57 57  CRP 12.7* 7.8* 6.9*    No results found for: SARSCOV2NAA  Follow up chest film showed acute non cardiogenic pulmonary edema, that improved with IV furosemide.   She will continue 5 more days of oral prednisone at home. Home oxygen will be arranged. Her oximetry on ambulation on room air was 88%.  Ultrasonography of lower extremities was negative for deep vein thrombosis.  Continue with flonase for nasal congestion and continue with saline nasal spry.   2.Hypertension. Continue blood pressure control with hydrochlorothiazide and losartan.  3.Asthma.No signs of acute exacerbation, continue montelukast and bronchodilators.  4.Hypokalemia.Patient received potassium chloride with correction of serum potassium. Her kidney function remained stable. At discharge sodium 138, potassium 3.7, chloride 96, bicarb 30, glucose 112, BUN 8, creatinine 0.82.  5.Obesity class III.Her calculated BMI is 43.1, continue outpatient follow-up.   Discharge Diagnoses:  Principal Problem:   Pneumonia due to COVID-19 virus Active Problems:   Depression   Asthma   Acute respiratory failure with hypoxia (HCC)   Class 3 obesity    Discharge Instructions  Discharge Instructions    Diet - low sodium heart healthy   Complete by: As directed  Discharge instructions   Complete  by: As directed    Please continue self quarantine for 10 more days, use a mask in public and maintain physical distancing.   Increase activity slowly   Complete by: As directed      Allergies as of 11/18/2020      Reactions   Penicillins Hives   Penicillins Rash      Medication List    STOP taking these medications   guaiFENesin 100 MG/5ML liquid Commonly known as: ROBITUSSIN   Wellbutrin XL 150 MG 24 hr tablet Generic drug: buPROPion   Wellbutrin XL 300 MG 24 hr tablet Generic drug: buPROPion     TAKE these medications   acetaminophen 325 MG tablet Commonly known as: TYLENOL Take 650 mg by mouth every 6 (six) hours as needed for mild pain, fever or headache.   albuterol 108 (90 Base) MCG/ACT inhaler Commonly known as: VENTOLIN HFA Inhale 1 puff into the lungs every 4 (four) hours as needed for shortness of breath or wheezing. What changed:   how much to take  when to take this  reasons to take this   Boric Acid Powd 1 applicator by Does not apply route.   budesonide-formoterol 160-4.5 MCG/ACT inhaler Commonly known as: SYMBICORT Inhale 2 puffs into the lungs 2 (two) times daily.   cetirizine 10 MG tablet Commonly known as: ZYRTEC Take 10 mg by mouth daily.   famotidine 20 MG tablet Commonly known as: PEPCID Take 20 mg by mouth daily.   fluticasone 50 MCG/ACT nasal spray Commonly known as: FLONASE Place 1 spray into both nostrils 2 (two) times daily for 5 days.   guaiFENesin-dextromethorphan 100-10 MG/5ML syrup Commonly known as: ROBITUSSIN DM Take 5 mLs by mouth every 6 (six) hours as needed for cough.   ibuprofen 200 MG tablet Commonly known as: ADVIL Take 600 mg by mouth every 6 (six) hours as needed for fever, headache or mild pain.   losartan-hydrochlorothiazide 50-12.5 MG tablet Commonly known as: HYZAAR Take 1 tablet by mouth daily.   montelukast 10 MG tablet Commonly known as: SINGULAIR Take 1 tablet (10 mg total) by mouth at  bedtime.   predniSONE 20 MG tablet Commonly known as: DELTASONE Take 1 tablet (20 mg total) by mouth daily for 5 days. Start taking on: November 19, 2020   VITAMIN C PO Take 1 tablet by mouth daily.   ZINC PO Take 1 tablet by mouth daily.            Durable Medical Equipment  (From admission, onward)         Start     Ordered   11/18/20 0941  For home use only DME oxygen  Once       Question Answer Comment  Length of Need 6 Months   Mode or (Route) Nasal cannula   Liters per Minute 3   Frequency Continuous (stationary and portable oxygen unit needed)   Oxygen conserving device Yes   Oxygen delivery system Gas      11/18/20 0940          Follow-up Information    Gillian Scarce, MD Follow up in 2 week(s).   Specialty: Family Medicine Contact information: [address] [place] [address] [phone]              Allergies  Allergen Reactions  . Penicillins Hives  . Penicillins Rash    Procedures/Studies: DG Chest 1 View  Result Date: 11/17/2020 CLINICAL DATA:  Asthma, dyspnea, cough, worsening symptoms EXAM: CHEST  1 VIEW COMPARISON:  11/10/2020 FINDINGS: The heart size and mediastinal contours are within normal limits. Interval increase in extensive bilateral heterogeneous airspace opacity. The visualized skeletal structures are unremarkable. IMPRESSION: Interval increase in extensive bilateral heterogeneous airspace opacity, consistent with worsened multifocal infection, including COVID-19 airspace disease if clinically suspected. Electronically Signed   By: Lauralyn Primes M.D.   On: 11/17/2020 12:04   CT Angio Chest PE W and/or Wo Contrast  Result Date: 11/10/2020 CLINICAL DATA:  PE suspected.  COVID-19 positive.  Hypoxic. EXAM: CT ANGIOGRAPHY CHEST WITH CONTRAST TECHNIQUE: Multidetector CT imaging of the chest was performed using the standard protocol during bolus administration of intravenous contrast. Multiplanar CT image  reconstructions and MIPs were obtained to evaluate the vascular anatomy. CONTRAST:  OMNIPAQUE IOHEXOL 350 MG/ML SOLN COMPARISON:  Plain films, most recent 1 day prior. FINDINGS: Cardiovascular: The quality of this exam for evaluation of pulmonary embolism is poor to moderate. Limitations include patient body habitus and bolus timing, centered in the SVC. No central or large lobar embolism. Smaller emboli cannot be exclude. Pulmonary artery enlargement, outflow tract 3.1 cm. Mediastinum/Nodes: No mediastinal or hilar adenopathy. Lungs/Pleura: No pleural fluid. Peripheral predominant airspace and less so ground-glass opacities bilaterally. Upper Abdomen: Moderate hepatic steatosis. Normal imaged portions of the spleen, stomach, pancreas, adrenal glands, kidneys. Musculoskeletal: No acute osseous abnormality. Review of the MIP images confirms the above findings. IMPRESSION: 1. Poor to moderate quality evaluation for pulmonary embolism. No central or large lobar embolism. Smaller emboli cannot be exclude. 2. Pulmonary artery enlargement suggests pulmonary arterial hypertension. 3. Peripheral predominant airspace and ground-glass opacities, consistent with COVID-19 pneumonia. 4. Hepatic steatosis. Electronically Signed   By: Jeronimo Greaves M.D.   On: 11/10/2020 13:25   DG Chest Port 1 View  Result Date: 11/10/2020 CLINICAL DATA:  Shortness of breath COVID positive EXAM: PORTABLE CHEST 1 VIEW COMPARISON:  Chest radiograph July 17, 2010 FINDINGS: The heart size and mediastinal contours are within normal limits. Low lung volumes. Left greater than right, peripheral predominant patchy opacities. No pleural effusion or pneumothorax. The visualized skeletal structures are unremarkable. IMPRESSION: Left greater than right, peripheral predominant patchy opacities compatible with COVID-19 pneumonia. Electronically Signed   By: Maudry Mayhew MD   On: 11/10/2020 11:22   VAS [place] LOWER EXTREMITY VENOUS (DVT)  Result  Date: 11/16/2020  Lower Venous DVT Study Indications: Elevated Ddimer.  Risk Factors: COVID 19 positive. Limitations: Poor ultrasound/tissue interface and body habitus. Comparison Study: No prior studies. Performing Technologist: Chanda Busing RVT  Examination Guidelines: A complete evaluation includes B-mode imaging, spectral Doppler, color Doppler, and power Doppler as needed of all accessible portions of each vessel. Bilateral testing is considered an integral part of a complete examination. Limited examinations for reoccurring indications may be performed as noted. The reflux portion of the exam is performed with the patient in reverse Trendelenburg.  +---------+---------------+---------+-----------+----------+--------------+ RIGHT    CompressibilityPhasicitySpontaneityPropertiesThrombus Aging +---------+---------------+---------+-----------+----------+--------------+ CFV      Full           Yes      Yes                                 +---------+---------------+---------+-----------+----------+--------------+ SFJ      Full                                                        +---------+---------------+---------+-----------+----------+--------------+  FV Prox  Full                                                        +---------+---------------+---------+-----------+----------+--------------+ FV Mid   Full                                                        +---------+---------------+---------+-----------+----------+--------------+ FV DistalFull                                                        +---------+---------------+---------+-----------+----------+--------------+ PFV      Full                                                        +---------+---------------+---------+-----------+----------+--------------+ POP      Full           Yes      Yes                                  +---------+---------------+---------+-----------+----------+--------------+ PTV      Full                                                        +---------+---------------+---------+-----------+----------+--------------+ PERO     Full                                                        +---------+---------------+---------+-----------+----------+--------------+   +---------+---------------+---------+-----------+----------+--------------+ LEFT     CompressibilityPhasicitySpontaneityPropertiesThrombus Aging +---------+---------------+---------+-----------+----------+--------------+ CFV      Full           Yes      Yes                                 +---------+---------------+---------+-----------+----------+--------------+ SFJ      Full                                                        +---------+---------------+---------+-----------+----------+--------------+ FV Prox  Full                                                        +---------+---------------+---------+-----------+----------+--------------+  FV Mid   Full                                                        +---------+---------------+---------+-----------+----------+--------------+ FV DistalFull                                                        +---------+---------------+---------+-----------+----------+--------------+ PFV      Full                                                        +---------+---------------+---------+-----------+----------+--------------+ POP      Full           Yes      Yes                                 +---------+---------------+---------+-----------+----------+--------------+ PTV      Full                                                        +---------+---------------+---------+-----------+----------+--------------+ PERO     Full                                                         +---------+---------------+---------+-----------+----------+--------------+     Summary: RIGHT: - There is no evidence of deep vein thrombosis in the lower extremity.  - No cystic structure found in the popliteal fossa.  LEFT: - There is no evidence of deep vein thrombosis in the lower extremity.  - No cystic structure found in the popliteal fossa.  *See table(s) above for measurements and observations. Electronically signed by Lemar Livings MD on 11/16/2020 at 1:56:50 PM.    Final         Subjective: Patient is feeling better, her dyspnea continue to improve, no nausea or vomiting,.   Discharge Exam: Vitals:   11/17/20 1939 11/18/20 0359  BP: 115/68 111/62  Pulse: (!) 102 89  Resp: 18 18  Temp: (!) 97.5 F (36.4 C) 98.2 F (36.8 C)  SpO2: 91% 91%   Vitals:   11/17/20 0555 11/17/20 1327 11/17/20 1939 11/18/20 0359  BP: 117/68 128/74 115/68 111/62  Pulse: (!) 109 94 (!) 102 89  Resp: 18 20 18 18   Temp: 98.1 F (36.7 C) 98.2 F (36.8 C) (!) 97.5 F (36.4 C) 98.2 F (36.8 C)  TempSrc: Oral Oral Oral Oral  SpO2: 92% 91% 91% 91%  Weight:      Height:        General: Not in pain or dyspnea Neurology: Awake and alert, non focal  E ENT: no pallor, no  icterus, oral mucosa moist Cardiovascular: No JVD. S1-S2 present, rhythmic, no gallops, rubs, or murmurs. No lower extremity edema. Pulmonary: positive breath sounds bilaterally, with no wheezing,  Gastrointestinal. Abdomen soft and non tender Skin. No rashes Musculoskeletal: no joint deformities   The results of significant diagnostics from this hospitalization (including imaging, microbiology, ancillary and laboratory) are listed below for reference.     Microbiology: Recent Results (from the past 240 hour(s))  Blood Culture (routine x 2)     Status: None   Collection Time: 11/10/20 10:40 AM   Specimen: BLOOD  Result Value Ref Range Status   Specimen Description   Final    BLOOD LEFT ANTECUBITAL Performed at [facility], [address] [place], [place] [address]    Special Requests   Final    BOTTLES DRAWN AEROBIC AND ANAEROBIC Blood Culture adequate volume Performed at [facility], [address], [place], [place] [address]    Culture   Final    NO GROWTH 5 DAYS Performed at [facility], [address], [place], [place] [address]    Report Status 11/15/2020 FINAL  Final  Blood Culture (routine x 2)     Status: None   Collection Time: 11/10/20 11:21 AM   Specimen: BLOOD  Result Value Ref Range Status   Specimen Description   Final    BLOOD RIGHT ANTECUBITAL Performed at [facility], [address], [place], [place] [address]    Special Requests   Final    BOTTLES DRAWN AEROBIC AND ANAEROBIC Blood Culture adequate volume Performed at [facility], [address] [place], [place] [address]    Culture   Final    NO GROWTH 5 DAYS Performed at [facility], [address], [place], [place] [address]    Report Status 11/15/2020 FINAL  Final     Labs: BNP (last 3 results) No results for input(s): BNP in the last 8760 hours. Basic Metabolic Panel: Recent Labs  Lab 11/12/20 0421 11/13/20 0424 11/14/20 0443 11/15/20 0113 11/16/20 0426 11/17/20 0431 11/18/20 0257  NA 138 138 137 137 136 137 138  K 3.7 3.8 3.8 3.1* 3.3* 4.4 3.7  CL 100 99 99 97* 98 101 96*  CO2 27 28 25 26 28 27 30   GLUCOSE 147* 162* 143* 95 111* 83 112*  BUN 12 13 12 13 9 8 9   CREATININE 0.77 0.69 0.82 0.75 0.73 0.69 0.82  CALCIUM 9.1 8.7* 9.1 8.8* 8.7* 8.5* 9.3  MG 2.1 2.2 2.5* 2.1  --   --   --   PHOS 4.2 4.5 3.9 3.2  --   --   --    Liver Function Tests: Recent Labs  Lab 11/13/20 0424 11/14/20 0443 11/15/20 0113 11/16/20 0426 11/18/20 0257  AST 30 30 27 21 19   ALT 25 29 27 25 22   ALKPHOS 61 62 60 58 63  BILITOT 0.4 0.4 0.7 0.5 0.5  PROT 7.3 7.3 7.0 6.9 7.5  ALBUMIN 3.5 3.6 3.5 3.3* 3.5   No results for input(s): LIPASE, AMYLASE in the  last 168 hours. No results for input(s): AMMONIA in the last 168 hours. CBC: Recent Labs  Lab 11/12/20 0421 11/13/20 0424 11/14/20 0443 11/15/20 0113  WBC 8.9 11.6* 13.2* 15.5*  NEUTROABS 6.8 9.2* 9.7* 10.9*  HGB 11.8* 11.2* 11.5* 11.5*  HCT 38.2 35.0* 36.8 36.1  MCV 78.3* 76.4* 77.0* 77.3*  PLT 407* 437* 507* 511*  Cardiac Enzymes: No results for input(s): CKTOTAL, CKMB, CKMBINDEX, TROPONINI in the last 168 hours. BNP: Invalid input(s): POCBNP CBG: Recent Labs  Lab 11/16/20 0749  GLUCAP 96   D-Dimer Recent Labs    11/17/20 0431 11/18/20 0257  DDIMER 3.89* 3.85*   Hgb A1c No results for input(s): HGBA1C in the last 72 hours. Lipid Profile No results for input(s): CHOL, HDL, LDLCALC, TRIG, CHOLHDL, LDLDIRECT in the last 72 hours. Thyroid function studies No results for input(s): TSH, T4TOTAL, T3FREE, THYROIDAB in the last 72 hours.  Invalid input(s): FREET3 Anemia work up [employer]    11/17/20 0431 11/18/20 0257  FERRITIN 57 57   Urinalysis No results found for: COLORURINE, APPEARANCEUR, LABSPEC, PHURINE, GLUCOSEU, HGBUR, BILIRUBINUR, KETONESUR, PROTEINUR, UROBILINOGEN, NITRITE, LEUKOCYTESUR Sepsis Labs Invalid input(s): PROCALCITONIN,  WBC,  LACTICIDVEN Microbiology Recent Results (from the past 240 hour(s))  Blood Culture (routine x 2)     Status: None   Collection Time: 11/10/20 10:40 AM   Specimen: BLOOD  Result Value Ref Range Status   Specimen Description   Final    BLOOD LEFT ANTECUBITAL Performed at [facility], [address], [place], [place] [address]    Special Requests   Final    BOTTLES DRAWN AEROBIC AND ANAEROBIC Blood Culture adequate volume Performed at [facility], [address], [place], [place] [address]    Culture   Final    NO GROWTH 5 DAYS Performed at [facility], [address] [place], [place] [address]    Report Status 11/15/2020 FINAL  Final  Blood Culture (routine x 2)     Status:  None   Collection Time: 11/10/20 11:21 AM   Specimen: BLOOD  Result Value Ref Range Status   Specimen Description   Final    BLOOD RIGHT ANTECUBITAL Performed at [facility], [address] [place], [place] [address]    Special Requests   Final    BOTTLES DRAWN AEROBIC AND ANAEROBIC Blood Culture adequate volume Performed at [facility], [address] [place], [place] [address]    Culture   Final    NO GROWTH 5 DAYS Performed at [facility], [address], [place], [place] [address]    Report Status 11/15/2020 FINAL  Final     Time coordinating discharge: 45 minutes  SIGNED:   Coralie Keens, MD  [facility] 11/18/2020, 9:44 AM

## 2020-11-18 NOTE — Progress Notes (Signed)
Discharge instructions given to patient and all questions were answered.  

## 2022-06-19 IMAGING — DX DG CHEST 1V PORT
1 series · 1 of 1 positions shown · non-contrast
Comparison: Chest radiograph July 17, 2010

CLINICAL DATA: Shortness of breath COVID positive

EXAM:
PORTABLE CHEST 1 VIEW

[chest ap]
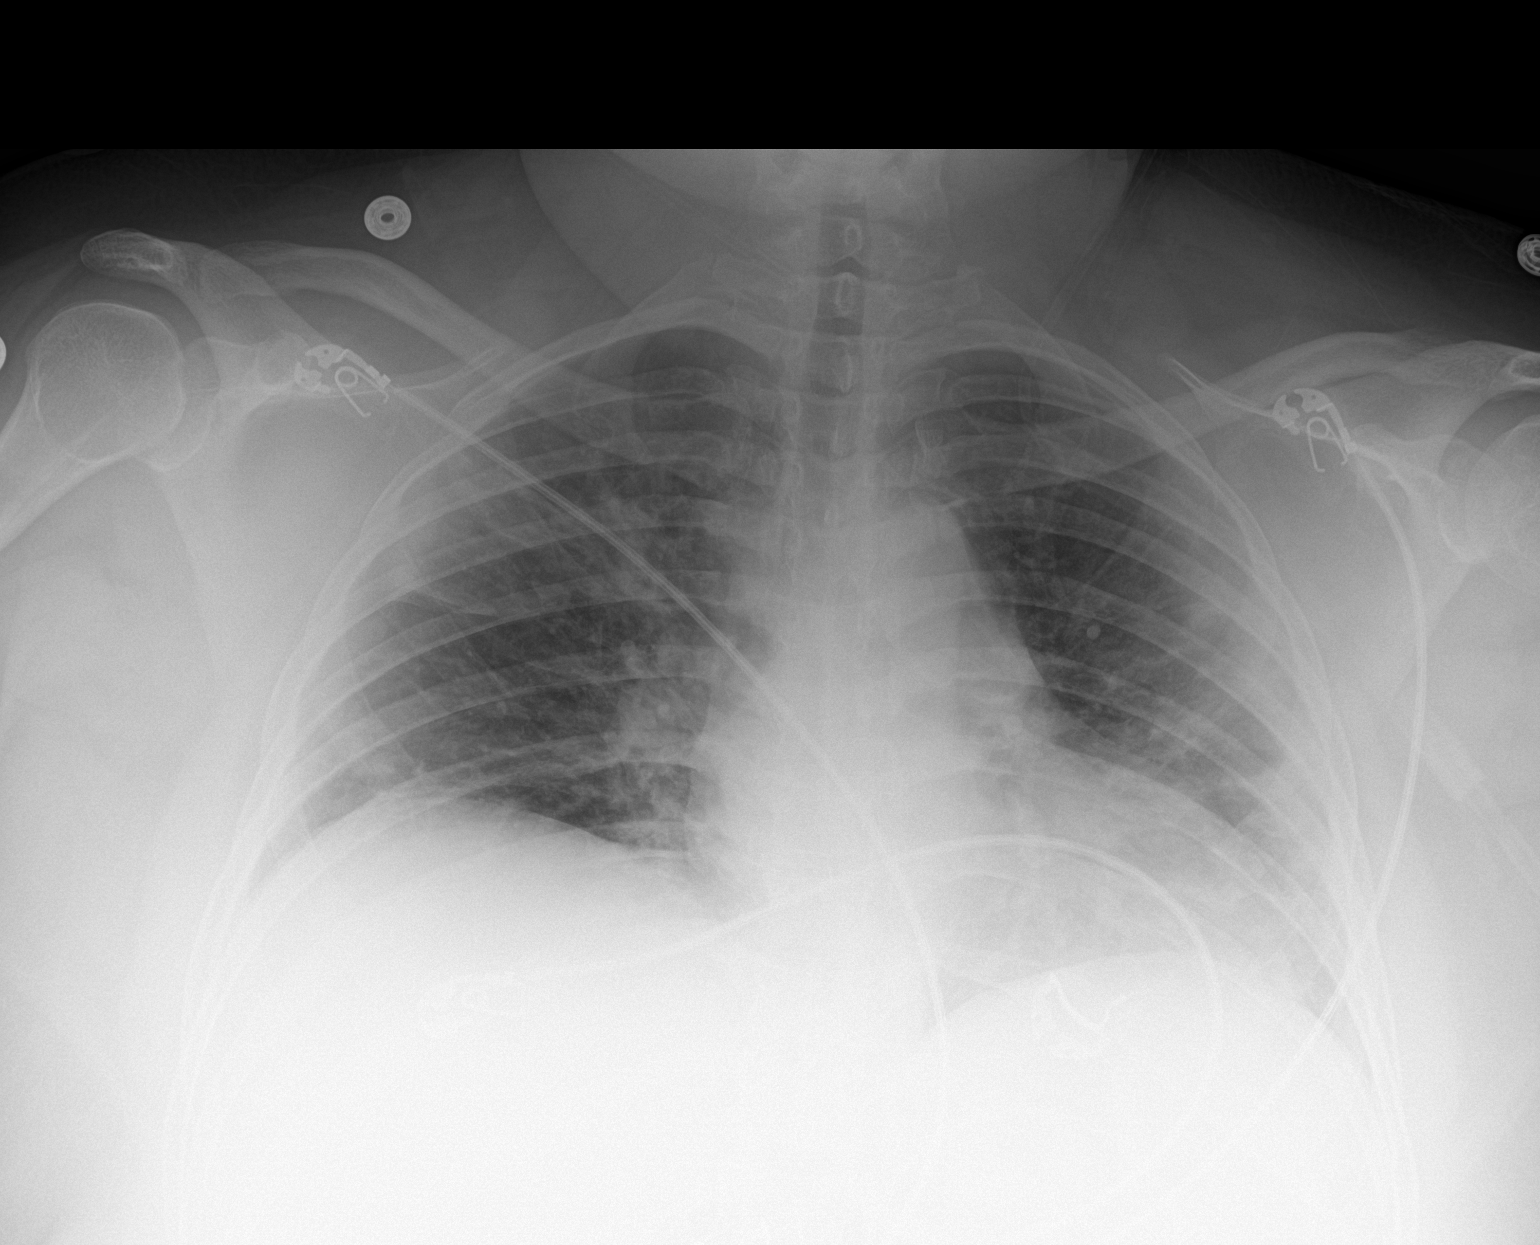

[1 of 1 positions shown; findings below may reference images not displayed]

FINDINGS: The heart size and mediastinal contours are within normal limits.
Low lung volumes. Left greater than right, peripheral predominant
patchy opacities. No pleural effusion or pneumothorax. The
visualized skeletal structures are unremarkable.
IMPRESSION: Left greater than right, peripheral predominant patchy opacities
compatible with [id number] pneumonia.
